# Patient Record
Sex: Female | Born: 1955 | Race: White | Hispanic: No | Marital: Married | Smoking: Never smoker
Health system: Southern US, Community
[De-identification: ages and names within clinical notes are randomized; demographics above are authoritative.]

## PROBLEM LIST (undated history)

## (undated) DIAGNOSIS — T7840XA Allergy, unspecified, initial encounter: Secondary | ICD-10-CM

## (undated) DIAGNOSIS — D649 Anemia, unspecified: Secondary | ICD-10-CM

## (undated) DIAGNOSIS — E785 Hyperlipidemia, unspecified: Secondary | ICD-10-CM

## (undated) DIAGNOSIS — E079 Disorder of thyroid, unspecified: Secondary | ICD-10-CM

## (undated) DIAGNOSIS — M810 Age-related osteoporosis without current pathological fracture: Secondary | ICD-10-CM

## (undated) DIAGNOSIS — M1990 Unspecified osteoarthritis, unspecified site: Secondary | ICD-10-CM

## (undated) HISTORY — DX: Hyperlipidemia, unspecified: E78.5

## (undated) HISTORY — PX: KNEE ARTHROSCOPY: SHX127

## (undated) HISTORY — PX: BREAST EXCISIONAL BIOPSY: SUR124

## (undated) HISTORY — DX: Disorder of thyroid, unspecified: E07.9

## (undated) HISTORY — PX: HERNIA REPAIR: SHX51

## (undated) HISTORY — DX: Anemia, unspecified: D64.9

## (undated) HISTORY — DX: Unspecified osteoarthritis, unspecified site: M19.90

## (undated) HISTORY — PX: APPENDECTOMY: SHX54

## (undated) HISTORY — DX: Allergy, unspecified, initial encounter: T78.40XA

## (undated) HISTORY — PX: BREAST LUMPECTOMY WITH AXILLARY LYMPH NODE BIOPSY: SHX5593

---

## 2002-09-12 ENCOUNTER — Encounter (INDEPENDENT_AMBULATORY_CARE_PROVIDER_SITE_OTHER): Payer: Self-pay

## 2002-09-12 ENCOUNTER — Ambulatory Visit (HOSPITAL_COMMUNITY): Admission: RE | Admit: 2002-09-12 | Discharge: 2002-09-12 | Payer: Self-pay | Admitting: *Deleted

## 2010-04-06 ENCOUNTER — Emergency Department (HOSPITAL_BASED_OUTPATIENT_CLINIC_OR_DEPARTMENT_OTHER): Admission: EM | Admit: 2010-04-06 | Discharge: 2010-04-06 | Payer: Self-pay | Admitting: Emergency Medicine

## 2010-11-22 NOTE — Op Note (Signed)
   NAME:  Kara Robinson, Kara Robinson                      ACCOUNT NO.:  [id number]   MEDICAL RECORD NO.:  [id number]                   PATIENT TYPE:  AMB   LOCATION:  ENDO                                 FACILITY:  [facility]   PHYSICIAN:  Georgiana Spinner, M.D.                 DATE OF BIRTH:  1955/07/25   DATE OF PROCEDURE:  DATE OF DISCHARGE:                                 OPERATIVE REPORT   PROCEDURE:  Upper endoscopy with biopsy.   INDICATIONS FOR PROCEDURE:  Hemoccult positivity.   ANESTHESIA:  Demerol 60, Versed 5 mg.   DESCRIPTION OF PROCEDURE:  With the patient mildly sedated in the left  lateral decubitus position, the Olympus videoscopic endoscope was inserted  in the mouth and passed under direct vision through the esophagus which  appeared normal other than one area of redness which we were not able to  photograph but we could biopsy. We entered into the stomach; the fundus,  body, antrum, duodenal bulb and second portion of the duodenum were  unremarkable.  From this point, the endoscope was slowly withdrawn taking  circumferential views of the remaining duodenal mucosa until the endoscope  was then pulled back in the stomach, placed in retroflexion to view the  stomach from below. The endoscope was then straightened and withdrawn taking  circumferential views of the remaining gastric and esophageal mucosa. The  patient's vital signs and pulse oximeter remained stable. The patient  tolerated the procedure well without apparent complications.   FINDINGS:  Question of reddened area of the distal esophagus biopsied,  otherwise, an unremarkable examination.   PLAN:  Proceed to colonoscopy as planned.                                                Georgiana Spinner, M.D.    GMO/MEDQ  D:  09/12/2002  T:  09/12/2002  Job:  [id number]

## 2010-11-22 NOTE — Op Note (Signed)
   NAME:  Kara Robinson, Kara Robinson                      ACCOUNT NO.:  [id number]   MEDICAL RECORD NO.:  [id number]                   PATIENT TYPE:  AMB   LOCATION:  ENDO                                 FACILITY:  [facility]   PHYSICIAN:  Georgiana Spinner, M.D.                 DATE OF BIRTH:  Dec 30, 1955   DATE OF PROCEDURE:  09/12/2002  DATE OF DISCHARGE:                                 OPERATIVE REPORT   PROCEDURE:  Colonoscopy.   INDICATIONS:  Hemoccult positivity.   ANESTHESIA:  Demerol 40 mg, Versed 5 mg.   DESCRIPTION OF PROCEDURE:  With the patient mildly sedated in the left  lateral decubitus position, the Olympus videoscopic colonoscope was inserted  in the rectum and passed through a very tortuous colon.  Eventually we  reached the cecum, identified by ileocecal valve and appendiceal orifice,  both of which were photographed.  From this point the colonoscope was slowly  withdrawn, taking circumferential views of the remaining very tortuous colon  until we reached the rectum, which appeared normal on direct and showed tags  on retroflexed view.  The endoscope was straightened and withdrawn.  The  patient's vital signs and pulse oximetry remained stable.  The patient  tolerated the procedure well without apparent complications.   FINDINGS:  Internal hemorrhoids, otherwise unremarkable colonoscopic  examination to the cecum other than a very tortuous colon.   PLAN:  Have the patient follow up with me as needed.                                               Georgiana Spinner, M.D.    GMO/MEDQ  D:  09/12/2002  T:  09/12/2002  Job:  [id number]   cc:   Dr. Simeon Craft, [facility]

## 2012-03-18 ENCOUNTER — Other Ambulatory Visit: Payer: Self-pay | Admitting: Orthopedic Surgery

## 2012-03-18 DIAGNOSIS — M25562 Pain in left knee: Secondary | ICD-10-CM

## 2012-03-21 ENCOUNTER — Ambulatory Visit
Admission: RE | Admit: 2012-03-21 | Discharge: 2012-03-21 | Disposition: A | Discharge status: Home / Self Care | Payer: 59 | Source: Ambulatory Visit | Attending: Orthopedic Surgery | Admitting: Orthopedic Surgery

## 2012-03-21 DIAGNOSIS — M25562 Pain in left knee: Secondary | ICD-10-CM

## 2012-07-28 ENCOUNTER — Other Ambulatory Visit: Payer: Self-pay | Admitting: Internal Medicine

## 2012-07-28 DIAGNOSIS — I83811 Varicose veins of right lower extremities with pain: Secondary | ICD-10-CM

## 2012-07-28 DIAGNOSIS — I8390 Asymptomatic varicose veins of unspecified lower extremity: Secondary | ICD-10-CM

## 2012-08-11 ENCOUNTER — Other Ambulatory Visit: Payer: 59

## 2012-08-24 ENCOUNTER — Ambulatory Visit
Admission: RE | Admit: 2012-08-24 | Discharge: 2012-08-24 | Disposition: A | Discharge status: Home / Self Care | Payer: Self-pay | Source: Ambulatory Visit | Attending: Internal Medicine | Admitting: Internal Medicine

## 2012-08-24 ENCOUNTER — Ambulatory Visit
Admission: RE | Admit: 2012-08-24 | Discharge: 2012-08-24 | Disposition: A | Discharge status: Home / Self Care | Payer: BC Managed Care – PPO | Source: Ambulatory Visit | Attending: Internal Medicine | Admitting: Internal Medicine

## 2012-08-24 DIAGNOSIS — I83811 Varicose veins of right lower extremities with pain: Secondary | ICD-10-CM

## 2012-08-24 DIAGNOSIS — I8390 Asymptomatic varicose veins of unspecified lower extremity: Secondary | ICD-10-CM

## 2012-09-04 LAB — HM COLONOSCOPY

## 2012-09-28 ENCOUNTER — Other Ambulatory Visit (HOSPITAL_COMMUNITY): Payer: Self-pay | Admitting: Interventional Radiology

## 2012-10-19 ENCOUNTER — Other Ambulatory Visit (HOSPITAL_COMMUNITY): Payer: Self-pay | Admitting: Interventional Radiology

## 2012-10-19 DIAGNOSIS — I83811 Varicose veins of right lower extremities with pain: Secondary | ICD-10-CM

## 2012-12-08 ENCOUNTER — Ambulatory Visit
Admission: RE | Admit: 2012-12-08 | Discharge: 2012-12-08 | Disposition: A | Discharge status: Home / Self Care | Payer: BC Managed Care – PPO | Source: Ambulatory Visit | Attending: Interventional Radiology | Admitting: Interventional Radiology

## 2012-12-08 DIAGNOSIS — I83811 Varicose veins of right lower extremities with pain: Secondary | ICD-10-CM

## 2012-12-08 NOTE — Progress Notes (Signed)
Patient here for 38-month follow-up to conservative treatment for varicose veins.  Patient states she continues with pain, swelling, throbbing of RLE despite wearing the compression hose.  Continues to take Aleve prn pain.  States LLE has some pain but not nearly as bad as the right side.  jkl

## 2013-01-03 ENCOUNTER — Telehealth: Payer: Self-pay | Admitting: Emergency Medicine

## 2013-01-03 ENCOUNTER — Other Ambulatory Visit (HOSPITAL_COMMUNITY): Payer: Self-pay | Admitting: Interventional Radiology

## 2013-01-03 DIAGNOSIS — I83811 Varicose veins of right lower extremities with pain: Secondary | ICD-10-CM

## 2013-01-03 NOTE — Telephone Encounter (Signed)
CALLED PT TO MAKE HER AWARE THAT HER INSURANCE APPROVED 3 SCLERO INJECTIONS AND IS GOOD TIL MAY 2015. SHE IS TAKING A BIG TRIP SOON AND  WILL CALL us WHEN SHE GETS BACK.

## 2013-04-07 ENCOUNTER — Other Ambulatory Visit: Payer: Self-pay | Admitting: Gastroenterology

## 2013-04-19 ENCOUNTER — Ambulatory Visit
Admission: RE | Admit: 2013-04-19 | Discharge: 2013-04-19 | Disposition: A | Discharge status: Home / Self Care | Payer: BC Managed Care – PPO | Source: Ambulatory Visit | Attending: Interventional Radiology | Admitting: Interventional Radiology

## 2013-04-19 DIAGNOSIS — I83811 Varicose veins of right lower extremities with pain: Secondary | ICD-10-CM

## 2013-04-20 ENCOUNTER — Other Ambulatory Visit (HOSPITAL_COMMUNITY): Payer: Self-pay | Admitting: Interventional Radiology

## 2013-04-20 DIAGNOSIS — I83811 Varicose veins of right lower extremities with pain: Secondary | ICD-10-CM

## 2013-04-22 ENCOUNTER — Ambulatory Visit: Payer: BC Managed Care – PPO

## 2013-05-17 ENCOUNTER — Ambulatory Visit
Admission: RE | Admit: 2013-05-17 | Discharge: 2013-05-17 | Disposition: A | Discharge status: Home / Self Care | Payer: BC Managed Care – PPO | Source: Ambulatory Visit | Attending: Interventional Radiology | Admitting: Interventional Radiology

## 2013-05-17 ENCOUNTER — Other Ambulatory Visit (HOSPITAL_COMMUNITY): Payer: Self-pay | Admitting: Interventional Radiology

## 2013-05-17 DIAGNOSIS — I83811 Varicose veins of right lower extremities with pain: Secondary | ICD-10-CM

## 2013-06-21 ENCOUNTER — Ambulatory Visit
Admission: RE | Admit: 2013-06-21 | Discharge: 2013-06-21 | Disposition: A | Discharge status: Home / Self Care | Payer: BC Managed Care – PPO | Source: Ambulatory Visit | Attending: Interventional Radiology | Admitting: Interventional Radiology

## 2013-06-21 DIAGNOSIS — I83811 Varicose veins of right lower extremities with pain: Secondary | ICD-10-CM

## 2015-07-08 LAB — HM PAP SMEAR: HM PAP: NORMAL

## 2017-04-06 ENCOUNTER — Telehealth: Payer: Self-pay

## 2017-04-06 NOTE — Telephone Encounter (Signed)
Pre visit call completed 

## 2017-04-07 ENCOUNTER — Encounter: Payer: Self-pay | Admitting: Family

## 2017-04-07 ENCOUNTER — Ambulatory Visit (INDEPENDENT_AMBULATORY_CARE_PROVIDER_SITE_OTHER): Payer: Managed Care, Other (non HMO) | Admitting: Family

## 2017-04-07 VITALS — BP 135/74 | HR 65 | Temp 97.9°F | Resp 16 | Ht 64.5 in | Wt 133.8 lb

## 2017-04-07 DIAGNOSIS — J302 Other seasonal allergic rhinitis: Secondary | ICD-10-CM

## 2017-04-07 DIAGNOSIS — E039 Hypothyroidism, unspecified: Secondary | ICD-10-CM | POA: Diagnosis not present

## 2017-04-07 DIAGNOSIS — M1990 Unspecified osteoarthritis, unspecified site: Secondary | ICD-10-CM | POA: Insufficient documentation

## 2017-04-07 DIAGNOSIS — Z23 Encounter for immunization: Secondary | ICD-10-CM

## 2017-04-07 DIAGNOSIS — Z0000 Encounter for general adult medical examination without abnormal findings: Secondary | ICD-10-CM

## 2017-04-07 DIAGNOSIS — E785 Hyperlipidemia, unspecified: Secondary | ICD-10-CM

## 2017-04-07 LAB — TSH: TSH: 0.54 u[IU]/mL (ref 0.35–4.50)

## 2017-04-07 MED ORDER — ZOSTER VAC RECOMB ADJUVANTED 50 MCG/0.5ML IM SUSR: INTRAMUSCULAR | 1 refills | Status: DC

## 2017-04-07 MED ORDER — CETIRIZINE HCL 10 MG PO TABS: 10.0000 mg | ORAL_TABLET | Freq: Every day | ORAL | 11 refills | Status: DC

## 2017-04-07 MED ORDER — LEVOTHYROXINE SODIUM 25 MCG PO TABS
25.0000 ug | ORAL_TABLET | Freq: Every day | ORAL | 1 refills | Status: DC
Start: 2017-04-07 — End: 2017-11-17

## 2017-04-07 MED ORDER — FLUTICASONE PROPIONATE 50 MCG/ACT NA SUSP: 2.0000 | Freq: Every day | NASAL | 6 refills | Status: AC

## 2017-04-07 NOTE — Assessment & Plan Note (Signed)
Stable on synthroid.  Obtain follow up TSH.

## 2017-04-07 NOTE — Assessment & Plan Note (Signed)
Will plan to obtain follow up lipid panel at her upcoming CPE.

## 2017-04-07 NOTE — Progress Notes (Signed)
Subjective:    Patient ID: Kara Robinson, female    DOB: March 14, 1956, 61 y.o.   MRN: [id number]  HPI  Kara Robinson is a 61 yr old female who presents today to establish care.  Reviewed most recent OV note from last PCP in Care everywhere.    Pmhx is significant for the following-  Hypothyroid- maintained on levothyroxine. TSH per care everywhere-  TSH 0.784 09/15/2016. She reports that she was diagnosed about 10 years ago. Has mild fatigue. Generally feels well though.    Arthritis-  Has in her feet/hands.  Uses tumeric tab which helps some.    Hx Anemia- reports that this was only during menopause due to heavy bleeding problems.   Allergies-Has seasonal allergies.  Uses zyrtec and flonase which helps her symptoms.    Hyperlipidemia- not on statin. Most recent lipids reviewed in Care everywhere as follows:   Component Value Date  CHOL 238 (H) 09/15/2016  TRIG 63 09/15/2016  HDL 84 09/15/2016  LDL 123 09/15/2016   Review of Systems  Constitutional: Negative for unexpected weight change.  HENT: Negative for hearing loss and rhinorrhea.   Eyes: Negative for visual disturbance.  Respiratory: Negative for cough and shortness of breath.   Cardiovascular: Negative for chest pain and leg swelling.  Gastrointestinal: Negative for diarrhea.       Reports gluten intolerance.  Has not had as much constipation off of gluten  Genitourinary: Negative for dysuria and frequency.  Musculoskeletal: Positive for arthralgias.  Skin: Negative for rash.  Neurological: Negative for headaches.  Hematological: Negative for adenopathy.  Psychiatric/Behavioral:       Denies depression/anxiety   Past Medical History:  Diagnosis Date  . Allergy   . Anemia   . Arthritis   . Hyperlipidemia    Family Hx  . Thyroid disease      Social History   Social History  . Marital status: Married    Spouse name: N/A  . Number of children: N/A  . Years of education: N/A   Occupational History  .  Not on file.   Social History Main Topics  . Smoking status: Never Smoker  . Smokeless tobacco: Never Used  . Alcohol use 0.6 - 1.2 oz/week    1 - 2 Glasses of wine per week  . Drug use: Unknown  . Sexual activity: Not on file   Other Topics Concern  . 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     Enjoys traveling, reading cooking, spending time with family   Married        Past Surgical History:  Procedure Laterality Date  . APPENDECTOMY    . BREAST LUMPECTOMY WITH AXILLARY LYMPH NODE BIOPSY    . HERNIA REPAIR    . KNEE ARTHROSCOPY Left     Family History  Problem Relation Age of Onset  . Heart disease Mother   . Hypertension Mother   . Kidney disease Father   . Kidney cancer Father   . Heart disease Sister        heart valve replacement--2016  . Diabetes type II Sister   . Other Sister        antisynthetase Syndrome    Allergies  Allergen Reactions  . Oxycodone Itching and Rash    Scratched herself until she  bled    No current outpatient prescriptions on file prior to visit.   No current facility-administered medications on file prior to visit.     BP 135/74 (BP Location: Right Arm, Cuff Size: Normal)   Pulse 65   Temp 97.9 F (36.6 C) (Oral)   Resp 16   Ht 5' 4.5" (1.638 m)   Wt 133 lb 12.8 oz (60.7 kg)   SpO2 100%   BMI 22.61 kg/m       Objective:   Physical Exam  Constitutional: She is oriented to person, place, and time. She appears well-developed and well-nourished.  HENT:  Head: Normocephalic.  Neck: No thyromegaly present.  Cardiovascular: Normal rate, regular rhythm and normal heart sounds.   No murmur heard. Pulmonary/Chest: Effort normal and breath sounds normal. No respiratory distress. She has no wheezes.  Lymphadenopathy:    She has no cervical adenopathy.  Neurological: She is  alert and oriented to person, place, and time.  Psychiatric: She has a normal mood and affect. Her behavior is normal. Judgment and thought content normal.          Assessment & Plan:  She is interested in Shingrix- we are out of stock. Will send rx to her pharmacy.

## 2017-04-07 NOTE — Addendum Note (Signed)
Addended by: Kelle Darting A on: 04/07/2017 05:13 PM   Modules accepted: Orders

## 2017-04-07 NOTE — Assessment & Plan Note (Signed)
· 

## 2017-04-07 NOTE — Patient Instructions (Addendum)
Please complete lab work prior to leaving. You can check with [facility] to see if they have the Shingrix in stock.   Schedule mammogram on the first floor. Schedule a complete physical at your convenience.   Welcome to [facility]!

## 2017-04-07 NOTE — Assessment & Plan Note (Signed)
Stable on zyrtec and flonase. Continue same.

## 2017-04-08 ENCOUNTER — Encounter: Payer: Self-pay | Admitting: Family

## 2017-04-08 NOTE — Progress Notes (Signed)
Mailed to pt/thx dmf

## 2017-04-28 ENCOUNTER — Encounter (HOSPITAL_BASED_OUTPATIENT_CLINIC_OR_DEPARTMENT_OTHER): Payer: Self-pay

## 2017-04-28 ENCOUNTER — Ambulatory Visit (INDEPENDENT_AMBULATORY_CARE_PROVIDER_SITE_OTHER): Payer: Managed Care, Other (non HMO) | Admitting: Family

## 2017-04-28 ENCOUNTER — Encounter: Payer: Self-pay | Admitting: Family

## 2017-04-28 ENCOUNTER — Ambulatory Visit (HOSPITAL_BASED_OUTPATIENT_CLINIC_OR_DEPARTMENT_OTHER)
Admission: RE | Admit: 2017-04-28 | Discharge: 2017-04-28 | Disposition: A | Discharge status: Home / Self Care | Payer: Managed Care, Other (non HMO) | Source: Ambulatory Visit | Attending: Family | Admitting: Family

## 2017-04-28 VITALS — BP 128/68 | HR 72 | Temp 97.9°F | Resp 16 | Ht 64.5 in | Wt 134.6 lb

## 2017-04-28 DIAGNOSIS — E559 Vitamin D deficiency, unspecified: Secondary | ICD-10-CM | POA: Diagnosis not present

## 2017-04-28 DIAGNOSIS — Z1231 Encounter for screening mammogram for malignant neoplasm of breast: Secondary | ICD-10-CM | POA: Diagnosis not present

## 2017-04-28 DIAGNOSIS — Z0000 Encounter for general adult medical examination without abnormal findings: Secondary | ICD-10-CM

## 2017-04-28 DIAGNOSIS — Z23 Encounter for immunization: Secondary | ICD-10-CM | POA: Diagnosis not present

## 2017-04-28 LAB — BASIC METABOLIC PANEL
BUN: 13 mg/dL (ref 6–23)
CHLORIDE: 105 meq/L (ref 96–112)
CO2: 31 meq/L (ref 19–32)
CREATININE: 0.63 mg/dL (ref 0.40–1.20)
Calcium: 9.1 mg/dL (ref 8.4–10.5)
GFR: 101.57 mL/min (ref >60.00)
Glucose, Bld: 80 mg/dL (ref 70–99)
Potassium: 4.2 mEq/L (ref 3.5–5.1)
Sodium: 141 mEq/L (ref 135–145)

## 2017-04-28 LAB — URINALYSIS, ROUTINE W REFLEX MICROSCOPIC
BILIRUBIN URINE: NEGATIVE
Hgb urine dipstick: NEGATIVE
KETONES UR: NEGATIVE
LEUKOCYTES UA: NEGATIVE
Nitrite: NEGATIVE
PH: 7.5 (ref 5.0–8.0)
RBC / HPF: NONE SEEN (ref 0–?)
Specific Gravity, Urine: 1.01 (ref 1.000–1.030)
TOTAL PROTEIN, URINE-UPE24: NEGATIVE
UROBILINOGEN UA: 0.2 (ref 0.0–1.0)
Urine Glucose: NEGATIVE

## 2017-04-28 LAB — LIPID PANEL
CHOL/HDL RATIO: 3
Cholesterol: 220 mg/dL — ABNORMAL HIGH (ref 0–200)
HDL: 83.5 mg/dL (ref >39.00)
LDL Cholesterol: 127 mg/dL — ABNORMAL HIGH (ref 0–99)
NONHDL: 136.74
TRIGLYCERIDES: 51 mg/dL (ref 0.0–149.0)
VLDL: 10.2 mg/dL (ref 0.0–40.0)

## 2017-04-28 LAB — CBC WITH DIFFERENTIAL/PLATELET
BASOS ABS: 0 10*3/uL (ref 0.0–0.1)
BASOS PCT: 0.3 % (ref 0.0–3.0)
EOS ABS: 0 10*3/uL (ref 0.0–0.7)
Eosinophils Relative: 0.6 % (ref 0.0–5.0)
HEMATOCRIT: 41.1 % (ref 36.0–46.0)
Hemoglobin: 13.4 g/dL (ref 12.0–15.0)
LYMPHS ABS: 1.5 10*3/uL (ref 0.7–4.0)
LYMPHS PCT: 34.2 % (ref 12.0–46.0)
MCHC: 32.7 g/dL (ref 30.0–36.0)
MCV: 95.7 fl (ref 78.0–100.0)
MONO ABS: 0.3 10*3/uL (ref 0.1–1.0)
Monocytes Relative: 7.7 % (ref 3.0–12.0)
NEUTROS ABS: 2.6 10*3/uL (ref 1.4–7.7)
NEUTROS PCT: 57.2 % (ref 43.0–77.0)
PLATELETS: 182 10*3/uL (ref 150.0–400.0)
RBC: 4.3 Mil/uL (ref 3.87–5.11)
RDW: 13.3 % (ref 11.5–15.5)
WBC: 4.5 10*3/uL (ref 4.0–10.5)

## 2017-04-28 LAB — HEPATIC FUNCTION PANEL
ALT: 13 U/L (ref 0–35)
AST: 15 U/L (ref 0–37)
Albumin: 4.1 g/dL (ref 3.5–5.2)
Alkaline Phosphatase: 40 U/L (ref 39–117)
BILIRUBIN DIRECT: 0.1 mg/dL (ref 0.0–0.3)
BILIRUBIN TOTAL: 0.6 mg/dL (ref 0.2–1.2)
TOTAL PROTEIN: 6.4 g/dL (ref 6.0–8.3)

## 2017-04-28 LAB — HM MAMMOGRAPHY

## 2017-04-28 LAB — VITAMIN D 25 HYDROXY (VIT D DEFICIENCY, FRACTURES): VITD: 45.81 ng/mL (ref 30.00–100.00)

## 2017-04-28 NOTE — Addendum Note (Signed)
Addended by: Kelle Darting A on: 04/28/2017 04:39 PM   Modules accepted: Orders

## 2017-04-28 NOTE — Progress Notes (Signed)
Subjective:    Patient ID: Kara Robinson, female    DOB: 1955/08/25, 61 y.o.   MRN: [id number]  HPI  Patient presents today for complete physical.  Immunizations: flu shot Diet: healthy Exercise: enjoys yoga, trainer, exercise bike every other day at home Colonoscopy: 2014, had polyps, was told follow up in 5 years.  Dexa: 2017- osteopenia per patient, takes calcium Pap Smear: 2017, normal per patient.  Mammogram:today Dental: 9/18 Vision: 6 months ago     Review of Systems  Constitutional: Negative for unexpected weight change.  HENT: Negative for rhinorrhea.        Some issues hearing in crowds, declines audiology referral  Eyes: Negative for visual disturbance.  Respiratory: Negative for cough and shortness of breath.   Cardiovascular: Negative for chest pain and leg swelling.  Gastrointestinal: Negative for blood in stool and diarrhea.       Occasional constipation- diet controlled  Genitourinary: Negative for dysuria, frequency and hematuria.  Musculoskeletal: Negative for arthralgias and myalgias.  Skin: Negative for rash.  Neurological: Negative for headaches.  Hematological: Negative for adenopathy.  Psychiatric/Behavioral:       Denies depression/anxiety   Past Medical History:  Diagnosis Date  . Allergy   . Anemia   . Arthritis   . Hyperlipidemia    Family Hx  . Thyroid disease      Social History   Social History  . Marital status: Married    Spouse name: N/A  . Number of children: N/A  . Years of education: N/A   Occupational History  . Not on file.   Social History Main Topics  . Smoking status: Never Smoker  . Smokeless tobacco: Never Used  . Alcohol use 0.6 - 1.2 oz/week    1 - 2 Glasses of wine per week  . Drug use: Unknown  . Sexual activity: Not on file   Other Topics Concern  . 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     Enjoys traveling, reading cooking, spending time with family   Married        Past Surgical History:  Procedure Laterality Date  . APPENDECTOMY    . BREAST LUMPECTOMY Left    x 2 , benign   . BREAST LUMPECTOMY WITH AXILLARY LYMPH NODE BIOPSY    . HERNIA REPAIR    . KNEE ARTHROSCOPY Left     Family History  Problem Relation Age of Onset  . Heart disease Mother        MI at age 64, smoker  . Hypertension Mother   . Kidney disease Father   . Kidney cancer Father   . Heart disease Sister        heart valve replacement--2016  . Diabetes type II Sister   . Other Sister        antisynthetase Syndrome    Allergies  Allergen Reactions  . Oxycodone Itching and Rash    Scratched herself until she bled    Current Outpatient Prescriptions on File Prior to Visit  Medication Sig Dispense Refill  . cetirizine (ZYRTEC) 10 MG tablet Take 1 tablet (10 mg total) by mouth daily. 30 tablet 11  . fluticasone (FLONASE) 50 MCG/ACT nasal spray Place 2 sprays into both nostrils daily. 16 g 6  . levothyroxine (SYNTHROID, LEVOTHROID)  25 MCG tablet Take 1 tablet (25 mcg total) by mouth daily. 90 tablet 1  . Zoster Vac Recomb Adjuvanted [facility]) injection Inject 82mcg IM now and again in 2-6 months (Patient not taking: Reported on 04/28/2017) 0.5 mL 1   No current facility-administered medications on file prior to visit.     BP 128/68 (BP Location: Right Arm, Cuff Size: Normal)   Pulse 72   Temp 97.9 F (36.6 C) (Oral)   Resp 16   Ht 5' 4.5" (1.638 m)   Wt 134 lb 9.6 oz (61.1 kg)   SpO2 99%   BMI 22.75 kg/m        Objective:   Physical Exam Physical Exam  Constitutional: She is oriented to person, place, and time. She appears well-developed and well-nourished. No distress.  HENT:  Head: Normocephalic and atraumatic.  Right Ear: Tympanic membrane and ear canal normal.  Left Ear: Tympanic membrane and ear canal normal.    Mouth/Throat: Oropharynx is clear and moist.  Eyes: Pupils are equal, round, and reactive to light. No scleral icterus.  Neck: Normal range of motion. No thyromegaly present.  Cardiovascular: Normal rate and regular rhythm.   No murmur heard. Pulmonary/Chest: Effort normal and breath sounds normal. No respiratory distress. He has no wheezes. She has no rales. She exhibits no tenderness.  Abdominal: Soft. Bowel sounds are normal. She exhibits no distension and no mass. There is no tenderness. There is no rebound and no guarding.  Musculoskeletal: She exhibits no edema.  Lymphadenopathy:    She has no cervical adenopathy.  Neurological: She is alert and oriented to person, place, and time. She has normal patellar reflexes. She exhibits normal muscle tone. Coordination normal.  Skin: Skin is warm and dry.  Psychiatric: She has a normal mood and affect. Her behavior is normal. Judgment and thought content normal.  Breasts: Examined lying Right: Without masses, retractions, discharge or axillary adenopathy.  Left: Without masses, retractions, discharge or axillary adenopathy.  Pelvic: deferred         Assessment & Plan:          Assessment & Plan:  Preventative Care- discussed continuing healthy diet and regular exercise. Obtain routine lab work. disussed shingrix which is on back order.  Tdap today.    EKG is personally reviewed.  No EKG available for comparison.  No acute changes.

## 2017-04-28 NOTE — Patient Instructions (Signed)
Please complete lab work prior to leaving.  Continue healthy diet and regular exercise.  

## 2017-04-30 ENCOUNTER — Other Ambulatory Visit: Payer: Self-pay | Admitting: Family Medicine

## 2017-04-30 ENCOUNTER — Telehealth: Payer: Self-pay | Admitting: Family

## 2017-04-30 ENCOUNTER — Encounter: Payer: Self-pay | Admitting: Family

## 2017-04-30 DIAGNOSIS — N6320 Unspecified lump in the left breast, unspecified quadrant: Secondary | ICD-10-CM

## 2017-04-30 NOTE — Telephone Encounter (Signed)
Relation to OI:PPGF Call back number:[phone]   D.O.D  Reason for call:  Patient had mamo 04/28/17 and was contacted by radiology stating additional breast images for further eval. Patient anxious to go today and would like orders placed. Patient was advised orders had to be placed with [facility], please advise

## 2017-04-30 NOTE — Telephone Encounter (Signed)
Spoke to patient, relayed below message. Patient verbalize understanding. Thank you

## 2017-04-30 NOTE — Telephone Encounter (Signed)
Please advise 

## 2017-04-30 NOTE — Telephone Encounter (Signed)
Please let patient know Dr. Charlett Blake has placed her orders

## 2017-04-30 NOTE — Telephone Encounter (Signed)
Doc of the day Wendling  Please advise

## 2017-05-06 ENCOUNTER — Other Ambulatory Visit: Payer: Self-pay | Admitting: Family Medicine

## 2017-05-06 ENCOUNTER — Ambulatory Visit
Admission: RE | Admit: 2017-05-06 | Discharge: 2017-05-06 | Disposition: A | Discharge status: Home / Self Care | Payer: Managed Care, Other (non HMO) | Source: Ambulatory Visit | Attending: Family Medicine | Admitting: Family Medicine

## 2017-05-06 DIAGNOSIS — N6320 Unspecified lump in the left breast, unspecified quadrant: Secondary | ICD-10-CM

## 2017-10-02 ENCOUNTER — Other Ambulatory Visit: Payer: Self-pay | Admitting: Family Medicine

## 2017-10-02 DIAGNOSIS — N6320 Unspecified lump in the left breast, unspecified quadrant: Secondary | ICD-10-CM

## 2017-10-06 ENCOUNTER — Other Ambulatory Visit: Payer: Self-pay | Admitting: Family Medicine

## 2017-10-06 ENCOUNTER — Ambulatory Visit
Admission: RE | Admit: 2017-10-06 | Discharge: 2017-10-06 | Disposition: A | Discharge status: Home / Self Care | Payer: Managed Care, Other (non HMO) | Source: Ambulatory Visit | Attending: Family Medicine | Admitting: Family Medicine

## 2017-10-06 DIAGNOSIS — N6320 Unspecified lump in the left breast, unspecified quadrant: Secondary | ICD-10-CM

## 2017-11-17 ENCOUNTER — Other Ambulatory Visit: Payer: Self-pay | Admitting: Family

## 2018-02-10 ENCOUNTER — Other Ambulatory Visit: Payer: Self-pay | Admitting: Family

## 2018-02-10 DIAGNOSIS — N390 Urinary tract infection, site not specified: Secondary | ICD-10-CM

## 2018-03-09 ENCOUNTER — Other Ambulatory Visit: Payer: Self-pay | Admitting: Family

## 2018-03-10 NOTE — Telephone Encounter (Signed)
Called pt and scheduled appt for 03/16/18.

## 2018-03-10 NOTE — Telephone Encounter (Signed)
14 day supply of levothyroxine sent to pharmacy as pt is past due for follow up and will need OV before further refills can be given. Please call pt to schedule appt as soon as possible. Thanks!

## 2018-03-16 ENCOUNTER — Ambulatory Visit: Payer: Managed Care, Other (non HMO) | Admitting: Family

## 2018-03-16 ENCOUNTER — Encounter: Payer: Self-pay | Admitting: Family

## 2018-03-16 VITALS — BP 141/66 | HR 67 | Temp 97.8°F | Resp 16 | Ht 65.0 in | Wt 149.6 lb

## 2018-03-16 DIAGNOSIS — E039 Hypothyroidism, unspecified: Secondary | ICD-10-CM

## 2018-03-16 DIAGNOSIS — Z9109 Other allergy status, other than to drugs and biological substances: Secondary | ICD-10-CM | POA: Diagnosis not present

## 2018-03-16 DIAGNOSIS — Z23 Encounter for immunization: Secondary | ICD-10-CM | POA: Diagnosis not present

## 2018-03-16 LAB — TSH: TSH: 0.48 u[IU]/mL (ref 0.35–4.50)

## 2018-03-16 NOTE — Addendum Note (Signed)
Addended by: Jiles Prows on: 03/16/2018 01:37 PM   Modules accepted: Orders

## 2018-03-16 NOTE — Progress Notes (Signed)
Subjective:    Patient ID: Kara Robinson, female    DOB: December 31, 1955, 62 y.o.   MRN: [id number]  HPI   Pt is a 62 yr old female who presents today for follow up.  Hypothyroid- she has gained 14 pounds this year. Reports that she has had a transition with moving and was eating out more. She is in her new home and plans to begin cooking more at home.  Lab Results  Component Value Date   TSH 0.54 04/07/2017   Wt Readings from Last 3 Encounters:  03/16/18 149 lb 9.6 oz (67.9 kg)  04/28/17 134 lb 9.6 oz (61.1 kg)  04/07/17 133 lb 12.8 oz (60.7 kg)    Seasonal allergies- reports that she is now on allegra and flonase. Reports that this has been helping.    Review of Systems See HPI  Past Medical History:  Diagnosis Date  . Allergy   . Anemia   . Arthritis   . Hyperlipidemia    Family Hx  . Thyroid disease      Social History   Socioeconomic History  . Marital status: Married    Spouse name: Not on file  . Number of children: Not on file  . Years of education: Not on file  . Highest education level: Not on file  Occupational History  . Not on file  Social Needs  . Financial resource strain: Not on file  . Food insecurity:    Worry: Not on file    Inability: Not on file  . Transportation needs:    Medical: Not on file    Non-medical: Not on file  Tobacco Use  . Smoking status: Never Smoker  . Smokeless tobacco: Never Used  Substance and Sexual Activity  . Alcohol use: Yes    Alcohol/week: 1.0 - 2.0 standard drinks    Types: 1 - 2 Glasses of wine per week  . Drug use: Not on file  . Sexual activity: Not on file  Lifestyle  . Physical activity:    Days per week: Not on file    Minutes per session: Not on file  . Stress: Not on file  Relationships  . Social connections:    Talks on phone: Not on file    Gets together: Not on file    Attends religious service: Not on file    Active member of club or organization: Not on file    Attends meetings of  clubs or organizations: Not on file    Relationship status: Not on file  . Intimate partner violence:    Fear of current or ex partner: Not on file    Emotionally abused: Not on file    Physically abused: Not on file    Forced sexual activity: Not on file  Other Topics Concern  . 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     Enjoys traveling, reading cooking, spending time with family   Married     Past Surgical History:  Procedure Laterality Date  . APPENDECTOMY    . BREAST EXCISIONAL BIOPSY Left   . BREAST LUMPECTOMY Left    x 2 , benign   . BREAST LUMPECTOMY WITH AXILLARY LYMPH NODE BIOPSY    . HERNIA REPAIR    . KNEE ARTHROSCOPY Left  Family History  Problem Relation Age of Onset  . Heart disease Mother        MI at age 78, smoker  . Hypertension Mother   . Kidney disease Father   . Kidney cancer Father   . Heart disease Sister        heart valve replacement--2016  . Diabetes type II Sister   . Other Sister        antisynthetase Syndrome    Allergies  Allergen Reactions  . Oxycodone Itching and Rash    Scratched herself until she bled    Current Outpatient Medications on File Prior to Visit  Medication Sig Dispense Refill  . fexofenadine (ALLEGRA) 180 MG tablet 1 tablet    . fluticasone (FLONASE) 50 MCG/ACT nasal spray Place 2 sprays into both nostrils daily. 16 g 6  . levothyroxine (SYNTHROID, LEVOTHROID) 25 MCG tablet TAKE 1 TABLET BY MOUTH DAILY 14 tablet 0  . Zoster Vac Recomb Adjuvanted [facility]) injection Inject 37mcg IM now and again in 2-6 months 0.5 mL 1   No current facility-administered medications on file prior to visit.     BP (!) 141/66 (BP Location: Right Arm, Patient Position: Sitting, Cuff Size: Small)   Pulse 67   Temp 97.8 F (36.6 C) (Oral)   Resp 16   Ht 5\' 5"  (1.651 m)   Wt 149 lb  9.6 oz (67.9 kg)   SpO2 99%   BMI 24.89 kg/m       Objective:   Physical Exam  Constitutional: She is oriented to person, place, and time. She appears well-developed and well-nourished.  Neck: No thyromegaly present.  Cardiovascular: Normal rate, regular rhythm and normal heart sounds.  No murmur heard. Pulmonary/Chest: Effort normal and breath sounds normal. No respiratory distress. She has no wheezes.  Neurological: She is alert and oriented to person, place, and time.  Skin: Skin is warm and dry.  Psychiatric: She has a normal mood and affect. Her behavior is normal. Judgment and thought content normal.          Assessment & Plan:  Hypothyroid- + weight gain. Obtain TSH, continue synthroid.  Environmental allergies- stable on allegra/flonase, continue same.  Flu shot and Shingrix #1 today.

## 2018-03-16 NOTE — Patient Instructions (Signed)
Please complete lab work prior to leaving.   

## 2018-03-17 ENCOUNTER — Telehealth: Payer: Self-pay | Admitting: Family

## 2018-03-17 DIAGNOSIS — E039 Hypothyroidism, unspecified: Secondary | ICD-10-CM

## 2018-03-17 MED ORDER — LEVOTHYROXINE SODIUM 25 MCG PO TABS: 12.5000 ug | ORAL_TABLET | Freq: Every day | ORAL | 1 refills | Status: DC

## 2018-03-17 NOTE — Telephone Encounter (Signed)
Notified pt and scheduled lab appt for 04/28/18 at 10:10am. Future order has been placed.

## 2018-03-17 NOTE — Telephone Encounter (Signed)
Please let patient know that it appears that her Synthroid dose is just slightly too strong for her.  I would like for her to cut the Synthroid in half and take half tablet once daily.  Please repeat TSH in 4 to 6 weeks.

## 2018-04-02 ENCOUNTER — Encounter: Payer: Self-pay | Admitting: Family

## 2018-04-08 ENCOUNTER — Other Ambulatory Visit: Payer: Managed Care, Other (non HMO)

## 2018-04-11 ENCOUNTER — Encounter (HOSPITAL_BASED_OUTPATIENT_CLINIC_OR_DEPARTMENT_OTHER): Payer: Self-pay | Admitting: Emergency Medicine

## 2018-04-11 ENCOUNTER — Emergency Department (HOSPITAL_BASED_OUTPATIENT_CLINIC_OR_DEPARTMENT_OTHER)
Admission: EM | Admit: 2018-04-11 | Discharge: 2018-04-11 | Disposition: A | Discharge status: Home / Self Care | Payer: Managed Care, Other (non HMO) | Attending: Emergency Medicine | Admitting: Emergency Medicine

## 2018-04-11 ENCOUNTER — Emergency Department (HOSPITAL_BASED_OUTPATIENT_CLINIC_OR_DEPARTMENT_OTHER): Payer: Managed Care, Other (non HMO)

## 2018-04-11 ENCOUNTER — Other Ambulatory Visit: Payer: Self-pay

## 2018-04-11 DIAGNOSIS — E039 Hypothyroidism, unspecified: Secondary | ICD-10-CM | POA: Diagnosis not present

## 2018-04-11 DIAGNOSIS — S91114A Laceration without foreign body of right lesser toe(s) without damage to nail, initial encounter: Secondary | ICD-10-CM | POA: Insufficient documentation

## 2018-04-11 DIAGNOSIS — J45909 Unspecified asthma, uncomplicated: Secondary | ICD-10-CM | POA: Diagnosis not present

## 2018-04-11 DIAGNOSIS — W260XXA Contact with knife, initial encounter: Secondary | ICD-10-CM | POA: Insufficient documentation

## 2018-04-11 DIAGNOSIS — Y93G3 Activity, cooking and baking: Secondary | ICD-10-CM | POA: Insufficient documentation

## 2018-04-11 DIAGNOSIS — Y929 Unspecified place or not applicable: Secondary | ICD-10-CM | POA: Diagnosis not present

## 2018-04-11 DIAGNOSIS — Y999 Unspecified external cause status: Secondary | ICD-10-CM | POA: Insufficient documentation

## 2018-04-11 MED ORDER — LIDOCAINE HCL (PF) 1 % IJ SOLN
5.0000 mL | Freq: Once | INTRAMUSCULAR | Status: AC
  Administered 2018-04-11: 5 mL
  Filled 2018-04-11: qty 5

## 2018-04-11 NOTE — Discharge Instructions (Addendum)
Treatment: Keep your wound dry and dressing applied until this time tomorrow. After 24 hours, you may wash with warm soapy water. Dry and apply antibiotic ointment and clean dressing. Do this daily until your sutures are removed. ° °Follow-up: Please follow-up with your primary care provider or return to emergency department in 10 days for suture removal. Be aware of signs of infection: fever, increasing pain, redness, swelling, drainage from the area. Please call your primary care provider or return to emergency department if you develop any of these symptoms or if any of the sutures come out prior to removal. Please return to the emergency department if you develop any other new or worsening symptoms. ° °

## 2018-04-11 NOTE — ED Notes (Signed)
Pt understood dc material. NAD noted. Suture removal discussed with patient

## 2018-04-11 NOTE — ED Provider Notes (Signed)
[facility] EMERGENCY DEPARTMENT Provider Note   CSN: [id number] Arrival date & time: 04/11/18  2049     History   Chief Complaint Chief Complaint  Patient presents with  . Extremity Laceration    HPI Kara Robinson is a 62 y.o. female with history of thyroid disease, asthma who presents with right second toe pain and laceration after dropping a knife on her foot.  She reports she was baking cookies and bare feet and accidentally dropped a knife which went straight on to her right second toe.  She has had continued bleeding.  She has full range of motion of the toe and denies any tingling.  She denies any other injuries.  Tetanus is up-to-date.  HPI  Past Medical History:  Diagnosis Date  . Allergy   . Anemia   . Arthritis   . Hyperlipidemia    Family Hx  . Thyroid disease     Patient Active Problem List   Diagnosis Date Noted  . Hypothyroid 04/07/2017  . Hyperlipidemia 04/07/2017  . Osteoarthritis 04/07/2017  . Seasonal allergies 04/07/2017    Past Surgical History:  Procedure Laterality Date  . APPENDECTOMY    . BREAST EXCISIONAL BIOPSY Left   . BREAST LUMPECTOMY Left    x 2 , benign   . BREAST LUMPECTOMY WITH AXILLARY LYMPH NODE BIOPSY    . HERNIA REPAIR    . KNEE ARTHROSCOPY Left      OB History   None      Home Medications    Prior to Admission medications   Medication Sig Start Date End Date Taking? Authorizing Provider  fexofenadine (ALLEGRA) 180 MG tablet 1 tablet    [provider]  fluticasone (FLONASE) 50 MCG/ACT nasal spray Place 2 sprays into both nostrils daily. 04/07/17   Debbrah Alar, NP  levothyroxine (SYNTHROID, LEVOTHROID) 25 MCG tablet Take 0.5 tablets (12.5 mcg total) by mouth daily. 03/17/18   Debbrah Alar, NP  Zoster Vac Recomb Adjuvanted [facility]) injection Inject 44mcg IM now and again in 2-6 months 04/07/17   Debbrah Alar, NP    Family History Family History  Problem Relation Age of  Onset  . Heart disease Mother        MI at age 11, smoker  . Hypertension Mother   . Kidney disease Father   . Kidney cancer Father   . Heart disease Sister        heart valve replacement--2016  . Diabetes type II Sister   . Other Sister        antisynthetase Syndrome    Social History Social History   Tobacco Use  . Smoking status: Never Smoker  . Smokeless tobacco: Never Used  Substance Use Topics  . Alcohol use: Yes    Alcohol/week: 1.0 - 2.0 standard drinks    Types: 1 - 2 Glasses of wine per week  . Drug use: Never     Allergies   Oxycodone   Review of Systems Review of Systems  Skin: Positive for wound.  Neurological: Negative for numbness.     Physical Exam Updated Vital Signs BP 131/60   Pulse 71   Temp 98.2 F (36.8 C) (Oral)   Resp 18   Ht 5' 4.5" (1.638 m)   Wt 66.2 kg   SpO2 97%   BMI 24.67 kg/m   Physical Exam  Constitutional: She appears well-developed and well-nourished. No distress.  HENT:  Head: Normocephalic and atraumatic.  Mouth/Throat: Oropharynx is clear and moist.  No oropharyngeal exudate.  Eyes: Pupils are equal, round, and reactive to light. Conjunctivae are normal. Right eye exhibits no discharge. Left eye exhibits no discharge. No scleral icterus.  Neck: Normal range of motion. Neck supple. No thyromegaly present.  Cardiovascular: Normal rate, regular rhythm, normal heart sounds and intact distal pulses. Exam reveals no gallop and no friction rub.  No murmur heard. Pulmonary/Chest: Effort normal and breath sounds normal. No stridor. No respiratory distress. She has no wheezes. She has no rales.  Musculoskeletal: She exhibits no edema.  1 cm laceration to the proximal aspect of the dorsal second toe, active slow bleeding, full range of motion of the toe with resistance, sensation intact, cap refill less than 2 seconds; DP pulse intact  Lymphadenopathy:    She has no cervical adenopathy.  Neurological: She is alert.  Coordination normal.  Skin: Skin is warm and dry. No rash noted. She is not diaphoretic. No pallor.  Psychiatric: She has a normal mood and affect.  Nursing note and vitals reviewed.    ED Treatments / Results  Labs (all labs ordered are listed, but only abnormal results are displayed) Labs Reviewed - No data to display  EKG None  Radiology Dg Toe 2nd Right  Result Date: 04/11/2018 CLINICAL DATA:  Dropped a knife on second toe, laceration. EXAM: RIGHT SECOND TOE COMPARISON:  None. FINDINGS: There is no evidence of fracture or dislocation. Hammertoe deformity of the digit. No radiopaque foreign body. Site of laceration not well delineated radiographically. Soft tissues are unremarkable. IMPRESSION: No fracture, dislocation, or radiopaque foreign body. Electronically Signed   By: Keith Rake M.D.   On: 04/11/2018 22:41    Procedures .Marland KitchenLaceration Repair Date/Time: 04/12/2018 12:03 AM Performed by: Frederica Kuster, PA-C Authorized by: Frederica Kuster, PA-C   Consent:    Consent obtained:  Verbal   Consent given by:  Patient   Risks discussed:  Pain, poor cosmetic result, infection and tendon damage   Alternatives discussed:  No treatment Anesthesia (see MAR for exact dosages):    Anesthesia method:  Local infiltration   Local anesthetic:  Lidocaine 1% w/o epi Laceration details:    Location:  Toe   Toe location:  R second toe   Length (cm):  1.5   Depth (mm):  4 Repair type:    Repair type:  Simple Pre-procedure details:    Preparation:  Patient was prepped and draped in usual sterile fashion and imaging obtained to evaluate for foreign bodies Exploration:    Hemostasis achieved with:  Direct pressure   Wound exploration: wound explored through full range of motion and entire depth of wound probed and visualized     Wound extent: no foreign bodies/material noted, no muscle damage noted, no tendon damage noted (patient has full ROM of digit, however question defect)  and no underlying fracture noted     Contaminated: no   Treatment:    Area cleansed with:  Saline   Amount of cleaning:  Standard   Irrigation solution:  Sterile saline   Irrigation volume:  100   Irrigation method:  Syringe   Visualized foreign bodies/material removed: no   Skin repair:    Repair method:  Sutures   Suture size:  5-0   Wound skin closure material used: Ethilon.   Suture technique:  Simple interrupted   Number of sutures:  2 Approximation:    Approximation:  Close Post-procedure details:    Dressing:  Antibiotic ointment and splint for protection  Patient tolerance of procedure:  Tolerated well, no immediate complications   (including critical care time)  Medications Ordered in ED Medications  lidocaine (PF) (XYLOCAINE) 1 % injection 5 mL (5 mLs Infiltration Given 04/11/18 2334)     Initial Impression / Assessment and Plan / ED Course  I have reviewed the triage vital signs and the nursing notes.  Pertinent labs & imaging results that were available during my care of the patient were reviewed by me and considered in my medical decision making (see chart for details).     Patient presenting with laceration to right second toe.  X-ray shows no fracture, dislocation, or foreign body.  Tetanus is up-to-date.  Patient has full range of motion of the digit, however question visualized defect in the tendon.  Will refer to orthopedics if patient's range of motion is different from prior to the injury after healing.  Laceration repaired.  Wound care discussed for home.  Patient advised to return or see her doctor in 10 days for suture removal.  She understands and agrees with plan.  Return precautions discussed.  Patient vitals stable throughout ED course and discharged in satisfactory condition. I discussed patient case with Dr. Ashok Cordia who guided the patient's management and agrees with plan.   Final Clinical Impressions(s) / ED Diagnoses   Final diagnoses:    Laceration of lesser toe of right foot without foreign body present or damage to nail, initial encounter    ED Discharge Orders    None       Frederica Kuster, PA-C 04/12/18 0006    Lajean Saver, MD 04/12/18 [phone]

## 2018-04-11 NOTE — ED Triage Notes (Signed)
Pt states she was cutting a pan of cookies and the knife slipped out of her hand and stabbed her middle toe on her right foot

## 2018-04-20 ENCOUNTER — Encounter: Payer: Self-pay | Admitting: Family

## 2018-04-20 ENCOUNTER — Ambulatory Visit: Payer: Managed Care, Other (non HMO) | Admitting: Family

## 2018-04-20 VITALS — BP 110/70 | HR 67 | Temp 97.8°F | Resp 16 | Ht 65.0 in | Wt 151.0 lb

## 2018-04-20 DIAGNOSIS — S91114A Laceration without foreign body of right lesser toe(s) without damage to nail, initial encounter: Secondary | ICD-10-CM

## 2018-04-20 NOTE — Progress Notes (Signed)
Subjective:    Patient ID: Kara Robinson, female    DOB: 1956/02/26, 62 y.o.   MRN: [id number]  HPI  Patient is a 62 yr old female who presents today for follow up of her her foot laceration.  She accidentally dropped a knife which struck her right second toe.  She presented to the ED on 10/6 and note is reviewed.  They performed x-rays which were unremarkable and there laceration was sutured. She was advised to follow up with [place] for suture removal.   She reports that the area is healing well. Denies numbness, pain or issues with ROM.    Review of Systems See HPI  Past Medical History:  Diagnosis Date  . Allergy   . Anemia   . Arthritis   . Hyperlipidemia    Family Hx  . Thyroid disease      Social History   Socioeconomic History  . Marital status: Married    Spouse name: Not on file  . Number of children: Not on file  . Years of education: Not on file  . Highest education level: Not on file  Occupational History  . Not on file  Social Needs  . Financial resource strain: Not on file  . Food insecurity:    Worry: Not on file    Inability: Not on file  . Transportation needs:    Medical: Not on file    Non-medical: Not on file  Tobacco Use  . Smoking status: Never Smoker  . Smokeless tobacco: Never Used  Substance and Sexual Activity  . Alcohol use: Yes    Alcohol/week: 1.0 - 2.0 standard drinks    Types: 1 - 2 Glasses of wine per week  . Drug use: Never  . Sexual activity: Not on file  Lifestyle  . Physical activity:    Days per week: Not on file    Minutes per session: Not on file  . Stress: Not on file  Relationships  . Social connections:    Talks on phone: Not on file    Gets together: Not on file    Attends religious service: Not on file    Active member of club or organization: Not on file    Attends meetings of clubs or organizations: Not on file    Relationship status: Not on file  . Intimate partner violence:    Fear of current or ex  partner: Not on file    Emotionally abused: Not on file    Physically abused: Not on file    Forced sexual activity: Not on file  Other Topics Concern  . 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     Enjoys traveling, reading cooking, spending time with family   Married     Past Surgical History:  Procedure Laterality Date  . APPENDECTOMY    . BREAST EXCISIONAL BIOPSY Left   . BREAST LUMPECTOMY Left    x 2 , benign   . BREAST LUMPECTOMY WITH AXILLARY LYMPH NODE BIOPSY    . HERNIA REPAIR    . KNEE ARTHROSCOPY Left     Family History  Problem Relation Age of Onset  . Heart disease Mother        MI at age 80, smoker  . Hypertension Mother   . Kidney disease  Father   . Kidney cancer Father   . Heart disease Sister        heart valve replacement--2016  . Diabetes type II Sister   . Other Sister        antisynthetase Syndrome    Allergies  Allergen Reactions  . Oxycodone Itching and Rash    Scratched herself until she bled    Current Outpatient Medications on File Prior to Visit  Medication Sig Dispense Refill  . fexofenadine (ALLEGRA) 180 MG tablet 1 tablet    . fluticasone (FLONASE) 50 MCG/ACT nasal spray Place 2 sprays into both nostrils daily. 16 g 6  . levothyroxine (SYNTHROID, LEVOTHROID) 25 MCG tablet Take 0.5 tablets (12.5 mcg total) by mouth daily. 45 tablet 1  . Zoster Vac Recomb Adjuvanted [facility]) injection Inject 62mcg IM now and again in 2-6 months 0.5 mL 1   No current facility-administered medications on file prior to visit.     BP 110/70 (BP Location: Right Arm, Patient Position: Sitting, Cuff Size: Small)   Pulse 67   Temp 97.8 F (36.6 C) (Oral)   Resp 16   Ht 5\' 5"  (1.651 m)   Wt 151 lb (68.5 kg)   SpO2 97%   BMI 25.13 kg/m       Objective:   Physical Exam  Constitutional: She is oriented  to person, place, and time. She appears well-developed and well-nourished.  Musculoskeletal:  Full ROM of right second toe  Neurological: She is alert and oriented to person, place, and time.  Skin: Skin is warm and dry.  Laceration right second toe is well healed.   Psychiatric: She has a normal mood and affect. Her behavior is normal. Judgment and thought content normal.          Assessment & Plan:  Laceration right toe- well healed. No sign of infection. 2 sutures removed. Pt tolerated without difficulty.

## 2018-04-28 ENCOUNTER — Other Ambulatory Visit: Payer: Managed Care, Other (non HMO)

## 2018-04-29 ENCOUNTER — Ambulatory Visit
Admission: RE | Admit: 2018-04-29 | Discharge: 2018-04-29 | Disposition: A | Discharge status: Home / Self Care | Payer: Managed Care, Other (non HMO) | Source: Ambulatory Visit | Attending: Family Medicine | Admitting: Family Medicine

## 2018-04-29 DIAGNOSIS — N6320 Unspecified lump in the left breast, unspecified quadrant: Secondary | ICD-10-CM

## 2018-05-18 ENCOUNTER — Ambulatory Visit (INDEPENDENT_AMBULATORY_CARE_PROVIDER_SITE_OTHER): Payer: Managed Care, Other (non HMO) | Admitting: Family

## 2018-05-18 ENCOUNTER — Encounter: Payer: Self-pay | Admitting: Family

## 2018-05-18 VITALS — BP 133/69 | HR 73 | Temp 97.6°F | Resp 16 | Ht 65.0 in | Wt 152.0 lb

## 2018-05-18 DIAGNOSIS — M8580 Other specified disorders of bone density and structure, unspecified site: Secondary | ICD-10-CM | POA: Diagnosis not present

## 2018-05-18 DIAGNOSIS — Z0000 Encounter for general adult medical examination without abnormal findings: Secondary | ICD-10-CM

## 2018-05-18 DIAGNOSIS — R682 Dry mouth, unspecified: Secondary | ICD-10-CM

## 2018-05-18 DIAGNOSIS — Z23 Encounter for immunization: Secondary | ICD-10-CM | POA: Diagnosis not present

## 2018-05-18 DIAGNOSIS — H9190 Unspecified hearing loss, unspecified ear: Secondary | ICD-10-CM

## 2018-05-18 LAB — BASIC METABOLIC PANEL
BUN: 11 mg/dL (ref 6–23)
CALCIUM: 9.2 mg/dL (ref 8.4–10.5)
CO2: 28 mEq/L (ref 19–32)
CREATININE: 0.64 mg/dL (ref 0.40–1.20)
Chloride: 104 mEq/L (ref 96–112)
GFR: 99.4 mL/min (ref >60.00)
GLUCOSE: 90 mg/dL (ref 70–99)
Potassium: 4.1 mEq/L (ref 3.5–5.1)
SODIUM: 141 meq/L (ref 135–145)

## 2018-05-18 LAB — CBC WITH DIFFERENTIAL/PLATELET
BASOS PCT: 0.6 % (ref 0.0–3.0)
Basophils Absolute: 0 10*3/uL (ref 0.0–0.1)
EOS PCT: 0.7 % (ref 0.0–5.0)
Eosinophils Absolute: 0 10*3/uL (ref 0.0–0.7)
HEMATOCRIT: 42.5 % (ref 36.0–46.0)
HEMOGLOBIN: 14.3 g/dL (ref 12.0–15.0)
Lymphocytes Relative: 40.6 % (ref 12.0–46.0)
Lymphs Abs: 1.9 10*3/uL (ref 0.7–4.0)
MCHC: 33.6 g/dL (ref 30.0–36.0)
MCV: 92.7 fl (ref 78.0–100.0)
MONO ABS: 0.3 10*3/uL (ref 0.1–1.0)
Monocytes Relative: 6.6 % (ref 3.0–12.0)
NEUTROS ABS: 2.4 10*3/uL (ref 1.4–7.7)
Neutrophils Relative %: 51.5 % (ref 43.0–77.0)
PLATELETS: 208 10*3/uL (ref 150.0–400.0)
RBC: 4.58 Mil/uL (ref 3.87–5.11)
RDW: 13.8 % (ref 11.5–15.5)
WBC: 4.6 10*3/uL (ref 4.0–10.5)

## 2018-05-18 LAB — URINALYSIS, ROUTINE W REFLEX MICROSCOPIC
Bilirubin Urine: NEGATIVE
KETONES UR: NEGATIVE
Leukocytes, UA: NEGATIVE
NITRITE: NEGATIVE
PH: 7 (ref 5.0–8.0)
Total Protein, Urine: NEGATIVE
Urine Glucose: NEGATIVE
Urobilinogen, UA: 0.2 (ref 0.0–1.0)
WBC UA: NONE SEEN (ref 0–?)

## 2018-05-18 LAB — HEPATIC FUNCTION PANEL
ALT: 10 U/L (ref 0–35)
AST: 14 U/L (ref 0–37)
Albumin: 4.5 g/dL (ref 3.5–5.2)
Alkaline Phosphatase: 65 U/L (ref 39–117)
BILIRUBIN DIRECT: 0.1 mg/dL (ref 0.0–0.3)
BILIRUBIN TOTAL: 0.5 mg/dL (ref 0.2–1.2)
Total Protein: 6.8 g/dL (ref 6.0–8.3)

## 2018-05-18 LAB — LIPID PANEL
CHOL/HDL RATIO: 3
CHOLESTEROL: 251 mg/dL — AB (ref 0–200)
HDL: 91.2 mg/dL (ref >39.00)
LDL CALC: 145 mg/dL — AB (ref 0–99)
NonHDL: 159.53
TRIGLYCERIDES: 71 mg/dL (ref 0.0–149.0)
VLDL: 14.2 mg/dL (ref 0.0–40.0)

## 2018-05-18 LAB — TSH: TSH: 0.57 u[IU]/mL (ref 0.35–4.50)

## 2018-05-18 NOTE — Patient Instructions (Signed)
Please complete lab work prior to leaving.   

## 2018-05-18 NOTE — Progress Notes (Signed)
Subjective:    Patient ID: Kara Robinson, female    DOB: 10-Jun-1956, 62 y.o.   MRN: [id number]  HPI  Patient presents today for complete physical.  Immunizations: Shinrix #2 Diet: healthy diet, started cooking more, trying to lose weight Exercise: trainer once a week, yoga once a week and rides bike Wt Readings from Last 3 Encounters:  05/18/18 152 lb (68.9 kg)  04/20/18 151 lb (68.5 kg)  04/11/18 146 lb (66.2 kg)    Colonoscopy: 2014 Dexa: due Pap Smear:1/17 Mammogram:  04/29/18  C/o dry mouth. Dry mouth and dry eyes.     Review of Systems  Constitutional: Negative for unexpected weight change.  HENT: Negative for rhinorrhea.        Finds herself asking to repeat things more often especially in a noisy room  Eyes: Negative for visual disturbance.  Respiratory: Negative for cough.   Cardiovascular: Negative for leg swelling.  Gastrointestinal: Negative for constipation (occasional) and diarrhea.  Genitourinary: Negative for dysuria, frequency and hematuria.  Musculoskeletal: Negative for arthralgias and myalgias.  Skin: Negative for rash.  Neurological: Negative for headaches.  Hematological: Negative for adenopathy.  Psychiatric/Behavioral:       Denies depression/anxiety   Past Medical History:  Diagnosis Date  . Allergy   . Anemia   . Arthritis   . Hyperlipidemia    Family Hx  . Thyroid disease      Social History   Socioeconomic History  . Marital status: Married    Spouse name: Not on file  . Number of children: Not on file  . Years of education: Not on file  . Highest education level: Not on file  Occupational History  . Not on file  Social Needs  . Financial resource strain: Not on file  . Food insecurity:    Worry: Not on file    Inability: Not on file  . Transportation needs:    Medical: Not on file    Non-medical: Not on file  Tobacco Use  . Smoking status: Never Smoker  . Smokeless tobacco: Never Used  Substance and Sexual  Activity  . Alcohol use: Yes    Alcohol/week: 1.0 - 2.0 standard drinks    Types: 1 - 2 Glasses of wine per week  . Drug use: Never  . Sexual activity: Not on file  Lifestyle  . Physical activity:    Days per week: Not on file    Minutes per session: Not on file  . Stress: Not on file  Relationships  . Social connections:    Talks on phone: Not on file    Gets together: Not on file    Attends religious service: Not on file    Active member of club or organization: Not on file    Attends meetings of clubs or organizations: Not on file    Relationship status: Not on file  . Intimate partner violence:    Fear of current or ex partner: Not on file    Emotionally abused: Not on file    Physically abused: Not on file    Forced sexual activity: Not on file  Other Topics Concern  . 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     Enjoys traveling, reading cooking, spending time with family   Married  Past Surgical History:  Procedure Laterality Date  . APPENDECTOMY    . BREAST EXCISIONAL BIOPSY Left    x 2  . BREAST LUMPECTOMY WITH AXILLARY LYMPH NODE BIOPSY    . HERNIA REPAIR    . KNEE ARTHROSCOPY Left     Family History  Problem Relation Age of Onset  . Heart disease Mother        MI at age 37, smoker  . Hypertension Mother   . Kidney disease Father   . Kidney cancer Father   . Heart disease Sister        heart valve replacement--2016  . Diabetes type II Sister   . Other Sister        antisynthetase Syndrome    Allergies  Allergen Reactions  . Oxycodone Itching and Rash    Scratched herself until she bled    Current Outpatient Medications on File Prior to Visit  Medication Sig Dispense Refill  . fexofenadine (ALLEGRA) 180 MG tablet 1 tablet    . fluticasone (FLONASE) 50 MCG/ACT nasal spray Place 2 sprays into both  nostrils daily. 16 g 6  . levothyroxine (SYNTHROID, LEVOTHROID) 25 MCG tablet Take 0.5 tablets (12.5 mcg total) by mouth daily. 45 tablet 1   No current facility-administered medications on file prior to visit.     BP 133/69 (BP Location: Left Arm, Cuff Size: Normal)   Pulse 73   Temp 97.6 F (36.4 C) (Oral)   Resp 16   Ht 5\' 5"  (1.651 m)   Wt 152 lb (68.9 kg)   SpO2 100%   BMI 25.29 kg/m       Objective:   Physical Exam  Physical Exam  Constitutional: She is oriented to person, place, and time. She appears well-developed and well-nourished. No distress.  HENT:  Head: Normocephalic and atraumatic.  Right Ear: Tympanic membrane and ear canal normal.  Left Ear: Tympanic membrane and ear canal normal.  Mouth/Throat: Oropharynx is clear and moist.  Eyes: Pupils are equal, round, and reactive to light. No scleral icterus.  Neck: Normal range of motion. No thyromegaly present.  Cardiovascular: Normal rate and regular rhythm.   No murmur heard. Pulmonary/Chest: Effort normal and breath sounds normal. No respiratory distress. He has no wheezes. She has no rales. She exhibits no tenderness.  Abdominal: Soft. Bowel sounds are normal. She exhibits no distension and no mass. There is no tenderness. There is no rebound and no guarding.  Musculoskeletal: She exhibits no edema.  Lymphadenopathy:    She has no cervical adenopathy.  Neurological: She is alert and oriented to person, place, and time. She has normal patellar reflexes. She exhibits normal muscle tone. Coordination normal.  Skin: Skin is warm and dry.  Psychiatric: She has a normal mood and affect. Her behavior is normal. Judgment and thought content normal.  Breasts: Examined lying Right: Without masses, retractions, discharge or axillary adenopathy.  Left: Without masses, retractions, discharge or axillary adenopathy.  Pelvic: deferred          Assessment & Plan:   Preventative care- Continue healthy diet/exercise.  Pap up to date. mammo up to date. Her colo was performed 5 years ago. I have asked her to contact her GI office to ask if they would like it repeated in 5 or 10 years and let me know if she needs a formal referral for colonoscopy. Shingrix #2 today.     Assessment & Plan:  EKG tracing is personally reviewed.  EKG notes NSR.  No  acute changes.   Dry mouth- obtain sjogren's antibodies.  Hearing problem- refer to audiology.

## 2018-05-19 ENCOUNTER — Encounter: Payer: Self-pay | Admitting: Family

## 2018-05-19 LAB — SJOGREN'S SYNDROME ANTIBODS(SSA + SSB)
SSA (Ro) (ENA) Antibody, IgG: <1 AI
SSB (La) (ENA) Antibody, IgG: <1 AI

## 2018-05-24 ENCOUNTER — Ambulatory Visit (HOSPITAL_BASED_OUTPATIENT_CLINIC_OR_DEPARTMENT_OTHER)
Admission: RE | Admit: 2018-05-24 | Discharge: 2018-05-24 | Disposition: A | Discharge status: Home / Self Care | Payer: Managed Care, Other (non HMO) | Source: Ambulatory Visit | Attending: Family | Admitting: Family

## 2018-05-24 ENCOUNTER — Encounter: Payer: Self-pay | Admitting: Family

## 2018-05-24 DIAGNOSIS — M8580 Other specified disorders of bone density and structure, unspecified site: Secondary | ICD-10-CM | POA: Diagnosis not present

## 2018-05-25 ENCOUNTER — Encounter: Payer: Self-pay | Admitting: Family

## 2018-05-25 DIAGNOSIS — E039 Hypothyroidism, unspecified: Secondary | ICD-10-CM

## 2018-05-26 MED ORDER — LEVOTHYROXINE SODIUM 25 MCG PO TABS: 12.5000 ug | ORAL_TABLET | Freq: Every day | ORAL | 1 refills | Status: DC

## 2018-07-12 ENCOUNTER — Other Ambulatory Visit: Payer: Managed Care, Other (non HMO)

## 2018-07-19 ENCOUNTER — Other Ambulatory Visit (INDEPENDENT_AMBULATORY_CARE_PROVIDER_SITE_OTHER): Payer: Managed Care, Other (non HMO)

## 2018-07-19 DIAGNOSIS — E039 Hypothyroidism, unspecified: Secondary | ICD-10-CM | POA: Diagnosis not present

## 2018-07-19 LAB — TSH: TSH: 0.88 u[IU]/mL (ref 0.35–4.50)

## 2018-08-17 ENCOUNTER — Telehealth: Payer: Self-pay | Admitting: *Deleted

## 2018-08-17 NOTE — Telephone Encounter (Signed)
Received Audiologic Report results from [facility]; forwarded to provider/SLS 02/11

## 2018-09-05 ENCOUNTER — Other Ambulatory Visit: Payer: Self-pay | Admitting: Family

## 2018-11-16 ENCOUNTER — Ambulatory Visit: Payer: Managed Care, Other (non HMO) | Admitting: Family

## 2018-12-02 ENCOUNTER — Telehealth: Payer: Self-pay | Admitting: Family

## 2018-12-02 NOTE — Telephone Encounter (Signed)
Copied from [facility] [id number]. Topic: Quick Communication - Appointment Cancellation >> Dec 02, 2018 11:54 AM Kara Robinson wrote: Patient needs to r/s her appt that she has on 6/2. Attempted office.

## 2018-12-07 ENCOUNTER — Ambulatory Visit: Payer: Managed Care, Other (non HMO) | Admitting: Family

## 2018-12-08 NOTE — Telephone Encounter (Signed)
Patient scheduled for 12-29-18

## 2018-12-29 ENCOUNTER — Ambulatory Visit (INDEPENDENT_AMBULATORY_CARE_PROVIDER_SITE_OTHER): Payer: Managed Care, Other (non HMO) | Admitting: Family

## 2018-12-29 ENCOUNTER — Other Ambulatory Visit: Payer: Self-pay

## 2018-12-29 DIAGNOSIS — E785 Hyperlipidemia, unspecified: Secondary | ICD-10-CM

## 2018-12-29 DIAGNOSIS — L989 Disorder of the skin and subcutaneous tissue, unspecified: Secondary | ICD-10-CM

## 2018-12-29 DIAGNOSIS — J302 Other seasonal allergic rhinitis: Secondary | ICD-10-CM | POA: Diagnosis not present

## 2018-12-29 DIAGNOSIS — E039 Hypothyroidism, unspecified: Secondary | ICD-10-CM | POA: Diagnosis not present

## 2018-12-29 MED ORDER — LEVOTHYROXINE SODIUM 25 MCG PO TABS: ORAL_TABLET | ORAL | 1 refills | Status: DC

## 2018-12-29 NOTE — Progress Notes (Signed)
Virtual Visit via Video Note  I connected with Kara Robinson on 12/29/18 at 11:00 AM EDT by a video enabled telemedicine application and verified that I am speaking with the correct person using two identifiers.  Location: Patient: home Provider: home    I discussed the limitations of evaluation and management by telemedicine and the availability of in person appointments. The patient expressed understanding and agreed to proceed.  History of Present Illness:  Patient is a 63 yr old female who presents today for follow up.  Hypothyroid- reports that she is feeling well on synthroid.   Reports that home weight is 145.   Wt Readings from Last 3 Encounters:  05/18/18 152 lb (68.9 kg)  04/20/18 151 lb (68.5 kg)  04/11/18 146 lb (66.2 kg)    Lab Results  Component Value Date   TSH 0.88 07/19/2018   Hyperlipidemia-  Lab Results  Component Value Date   CHOL 251 (H) 05/18/2018   HDL 91.20 05/18/2018   LDLCALC 145 (H) 05/18/2018   TRIG 71.0 05/18/2018   CHOLHDL 3 05/18/2018   Seasonal allergies- continues allegra and flonase. Uses an occasional sudafed which helps.  Reports that she is concerned about a skin lesion on her right temple which has changed and would like a referral to dermatology.   Past Medical History:  Diagnosis Date  . Allergy   . Anemia   . Arthritis   . Hyperlipidemia    Family Hx  . Thyroid disease      Social History   Socioeconomic History  . Marital status: Married    Spouse name: Not on file  . Number of children: Not on file  . Years of education: Not on file  . Highest education level: Not on file  Occupational History  . Not on file  Social Needs  . Financial resource strain: Not on file  . Food insecurity    Worry: Not on file    Inability: Not on file  . Transportation needs    Medical: Not on file    Non-medical: Not on file  Tobacco Use  . Smoking status: Never Smoker  . Smokeless tobacco: Never Used  Substance and  Sexual Activity  . Alcohol use: Yes    Alcohol/week: 1.0 - 2.0 standard drinks    Types: 1 - 2 Glasses of wine per week  . Drug use: Never  . Sexual activity: Not on file  Lifestyle  . Physical activity    Days per week: Not on file    Minutes per session: Not on file  . Stress: Not on file  Relationships  . Social Herbalist on phone: Not on file    Gets together: Not on file    Attends religious service: Not on file    Active member of club or organization: Not on file    Attends meetings of clubs or organizations: Not on file    Relationship status: Not on file  . Intimate partner violence    Fear of current or ex partner: Not on file    Emotionally abused: Not on file    Physically abused: Not on file    Forced sexual activity: Not on file  Other Topics Concern  . 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  Enjoys traveling, reading cooking, spending time with family   Married     Past Surgical History:  Procedure Laterality Date  . APPENDECTOMY    . BREAST EXCISIONAL BIOPSY Left    x 2  . BREAST LUMPECTOMY WITH AXILLARY LYMPH NODE BIOPSY    . HERNIA REPAIR    . KNEE ARTHROSCOPY Left     Family History  Problem Relation Age of Onset  . Heart disease Mother        MI at age 59, smoker  . Hypertension Mother   . Kidney disease Father   . Kidney cancer Father   . Heart disease Sister        heart valve replacement--2016  . Diabetes type II Sister   . Other Sister        antisynthetase Syndrome    Allergies  Allergen Reactions  . Oxycodone Itching and Rash    Scratched herself until she bled    Current Outpatient Medications on File Prior to Visit  Medication Sig Dispense Refill  . Biotin 1 MG CAPS Take by mouth.    . cholecalciferol (VITAMIN D3) 25 MCG (1000 UT) tablet Take 1,000 Units by mouth  daily.    . Coenzyme Q10 10 MG capsule Take by mouth.    . fexofenadine (ALLEGRA) 180 MG tablet 1 tablet    . fluticasone (FLONASE) 50 MCG/ACT nasal spray Place 2 sprays into both nostrils daily. 16 g 6   No current facility-administered medications on file prior to visit.     There were no vitals taken for this visit.     Observations/Objective:   Gen: Awake, alert, no acute distress Resp: Breathing is even and non-labored Psych: calm/pleasant demeanor Neuro: Alert and Oriented x 3, + facial symmetry, speech is clear.   Assessment and Plan:  Hyperlipidemia- obtain follow up lipid panel. Continue low fat/low cholesterol diet and exercise. Commended pt on her weight loss.  Hypothyroid- obtain follow up tsh.  Clinically stable on sythroid.   Seasonal allergies- stable on current medication regimen. Continue same.  Follow Up Instructions:    I discussed the assessment and treatment plan with the patient. The patient was provided an opportunity to ask questions and all were answered. The patient agreed with the plan and demonstrated an understanding of the instructions.   The patient was advised to call back or seek an in-person evaluation if the symptoms worsen or if the condition fails to improve as anticipated.  [name], NP

## 2019-01-06 ENCOUNTER — Other Ambulatory Visit (INDEPENDENT_AMBULATORY_CARE_PROVIDER_SITE_OTHER): Payer: Managed Care, Other (non HMO)

## 2019-01-06 ENCOUNTER — Other Ambulatory Visit: Payer: Self-pay

## 2019-01-06 DIAGNOSIS — E039 Hypothyroidism, unspecified: Secondary | ICD-10-CM

## 2019-01-06 DIAGNOSIS — E785 Hyperlipidemia, unspecified: Secondary | ICD-10-CM

## 2019-01-06 LAB — LIPID PANEL
Cholesterol: 238 mg/dL — ABNORMAL HIGH (ref 0–200)
HDL: 88.2 mg/dL (ref >39.00)
LDL Cholesterol: 138 mg/dL — ABNORMAL HIGH (ref 0–99)
NonHDL: 150.24
Total CHOL/HDL Ratio: 3
Triglycerides: 62 mg/dL (ref 0.0–149.0)
VLDL: 12.4 mg/dL (ref 0.0–40.0)

## 2019-01-06 LAB — TSH: TSH: 0.78 u[IU]/mL (ref 0.35–4.50)

## 2019-01-06 NOTE — Addendum Note (Signed)
Addended by: Kelle Darting A on: 01/06/2019 09:38 AM   Modules accepted: Orders

## 2019-04-01 ENCOUNTER — Other Ambulatory Visit: Payer: Self-pay | Admitting: Family

## 2019-04-01 ENCOUNTER — Other Ambulatory Visit: Payer: Self-pay | Admitting: Emergency Medicine

## 2019-04-01 DIAGNOSIS — D242 Benign neoplasm of left breast: Secondary | ICD-10-CM

## 2019-04-04 ENCOUNTER — Encounter: Payer: Managed Care, Other (non HMO) | Admitting: Family

## 2019-04-07 ENCOUNTER — Ambulatory Visit (INDEPENDENT_AMBULATORY_CARE_PROVIDER_SITE_OTHER): Payer: Managed Care, Other (non HMO)

## 2019-04-07 ENCOUNTER — Other Ambulatory Visit: Payer: Self-pay

## 2019-04-07 DIAGNOSIS — Z23 Encounter for immunization: Secondary | ICD-10-CM | POA: Diagnosis not present

## 2019-04-07 NOTE — Progress Notes (Signed)
Here for flu shot

## 2019-04-08 ENCOUNTER — Encounter: Payer: Managed Care, Other (non HMO) | Admitting: Family

## 2019-05-02 ENCOUNTER — Ambulatory Visit
Admission: RE | Admit: 2019-05-02 | Discharge: 2019-05-02 | Disposition: A | Discharge status: Home / Self Care | Payer: Managed Care, Other (non HMO) | Source: Ambulatory Visit | Attending: Family | Admitting: Family

## 2019-05-02 ENCOUNTER — Other Ambulatory Visit: Payer: Self-pay

## 2019-05-02 DIAGNOSIS — D242 Benign neoplasm of left breast: Secondary | ICD-10-CM

## 2019-05-19 ENCOUNTER — Other Ambulatory Visit: Payer: Self-pay

## 2019-05-20 ENCOUNTER — Other Ambulatory Visit: Payer: Self-pay

## 2019-05-20 ENCOUNTER — Other Ambulatory Visit (HOSPITAL_COMMUNITY)
Admission: RE | Admit: 2019-05-20 | Discharge: 2019-05-20 | Disposition: A | Discharge status: Home / Self Care | Payer: Managed Care, Other (non HMO) | Source: Ambulatory Visit | Attending: Family | Admitting: Family

## 2019-05-20 ENCOUNTER — Encounter: Payer: Self-pay | Admitting: Family

## 2019-05-20 ENCOUNTER — Ambulatory Visit (INDEPENDENT_AMBULATORY_CARE_PROVIDER_SITE_OTHER): Payer: Managed Care, Other (non HMO) | Admitting: Family

## 2019-05-20 VITALS — BP 129/69 | HR 67 | Temp 96.0°F | Resp 16 | Ht 64.5 in | Wt 147.0 lb

## 2019-05-20 DIAGNOSIS — Z0000 Encounter for general adult medical examination without abnormal findings: Secondary | ICD-10-CM

## 2019-05-20 DIAGNOSIS — Z01419 Encounter for gynecological examination (general) (routine) without abnormal findings: Secondary | ICD-10-CM | POA: Diagnosis not present

## 2019-05-20 LAB — CBC WITH DIFFERENTIAL/PLATELET
Basophils Absolute: 0 10*3/uL (ref 0.0–0.1)
Basophils Relative: 0.4 % (ref 0.0–3.0)
Eosinophils Absolute: 0 10*3/uL (ref 0.0–0.7)
Eosinophils Relative: 1.1 % (ref 0.0–5.0)
HCT: 41.2 % (ref 36.0–46.0)
Hemoglobin: 13.9 g/dL (ref 12.0–15.0)
Lymphocytes Relative: 42.6 % (ref 12.0–46.0)
Lymphs Abs: 1.5 10*3/uL (ref 0.7–4.0)
MCHC: 33.8 g/dL (ref 30.0–36.0)
MCV: 93.4 fl (ref 78.0–100.0)
Monocytes Absolute: 0.3 10*3/uL (ref 0.1–1.0)
Monocytes Relative: 9.4 % (ref 3.0–12.0)
Neutro Abs: 1.6 10*3/uL (ref 1.4–7.7)
Neutrophils Relative %: 46.5 % (ref 43.0–77.0)
Platelets: 187 10*3/uL (ref 150.0–400.0)
RBC: 4.42 Mil/uL (ref 3.87–5.11)
RDW: 13.8 % (ref 11.5–15.5)
WBC: 3.4 10*3/uL — ABNORMAL LOW (ref 4.0–10.5)

## 2019-05-20 LAB — HEPATIC FUNCTION PANEL
ALT: 10 U/L (ref 0–35)
AST: 15 U/L (ref 0–37)
Albumin: 4.4 g/dL (ref 3.5–5.2)
Alkaline Phosphatase: 50 U/L (ref 39–117)
Bilirubin, Direct: 0.1 mg/dL (ref 0.0–0.3)
Total Bilirubin: 0.6 mg/dL (ref 0.2–1.2)
Total Protein: 6.4 g/dL (ref 6.0–8.3)

## 2019-05-20 LAB — BASIC METABOLIC PANEL
BUN: 9 mg/dL (ref 6–23)
CO2: 29 mEq/L (ref 19–32)
Calcium: 9 mg/dL (ref 8.4–10.5)
Chloride: 103 mEq/L (ref 96–112)
Creatinine, Ser: 0.75 mg/dL (ref 0.40–1.20)
GFR: 77.63 mL/min (ref >60.00)
Glucose, Bld: 84 mg/dL (ref 70–99)
Potassium: 4 mEq/L (ref 3.5–5.1)
Sodium: 139 mEq/L (ref 135–145)

## 2019-05-20 LAB — TSH: TSH: 0.79 u[IU]/mL (ref 0.35–4.50)

## 2019-05-20 LAB — LIPID PANEL
Cholesterol: 239 mg/dL — ABNORMAL HIGH (ref 0–200)
HDL: 75.3 mg/dL (ref >39.00)
LDL Cholesterol: 150 mg/dL — ABNORMAL HIGH (ref 0–99)
NonHDL: 163.32
Total CHOL/HDL Ratio: 3
Triglycerides: 68 mg/dL (ref 0.0–149.0)
VLDL: 13.6 mg/dL (ref 0.0–40.0)

## 2019-05-20 NOTE — Progress Notes (Signed)
Subjective:    Patient ID: Kara Robinson, female    DOB: [birth year], 63 y.o.   MRN: [id number]  HPI   Patient presents today for complete physical.  Immunizations: up to date Diet: healthy Exercise: walks 3 miles a day, stationary bike Wt Readings from Last 3 Encounters:  05/20/19 147 lb (66.7 kg)  05/18/18 152 lb (68.9 kg)  04/20/18 151 lb (68.5 kg)  Colonoscopy:2014 Dexa: 2019 Pap [id number] Mammogram: 10/26 Vision: up to date Dental: up to date   Review of Systems  Constitutional: Negative for unexpected weight change.  HENT: Negative for hearing loss and rhinorrhea.   Eyes: Negative for visual disturbance.  Respiratory: Negative for cough and shortness of breath.   Cardiovascular: Negative for chest pain.  Gastrointestinal: Negative for blood in stool, constipation and diarrhea.  Genitourinary: Negative for dysuria and hematuria.  Musculoskeletal: Negative for arthralgias and myalgias.  Skin: Negative for rash.  Neurological: Negative for headaches.  Hematological: Negative for adenopathy.  Psychiatric/Behavioral:       Denies depression/anxiety   Past Medical History:  Diagnosis Date  . Allergy   . Anemia   . Arthritis   . Hyperlipidemia    Family Hx  . Thyroid disease      Social History   Socioeconomic History  . Marital status: Married    Spouse name: Not on file  . Number of children: Not on file  . Years of education: Not on file  . Highest education level: Not on file  Occupational History  . Not on file  Social Needs  . Financial resource strain: Not on file  . Food insecurity    Worry: Not on file    Inability: Not on file  . Transportation needs    Medical: Not on file    Non-medical: Not on file  Tobacco Use  . Smoking status: Never Smoker  . Smokeless tobacco: Never Used  Substance and Sexual Activity  . Alcohol use: Yes    Alcohol/week: 1.0 - 2.0 standard drinks    Types: 1 - 2 Glasses of wine per week  . Drug use:  Never  . Sexual activity: Not on file  Lifestyle  . Physical activity    Days per week: Not on file    Minutes per session: Not on file  . Stress: Not on file  Relationships  . Social Herbalist on phone: Not on file    Gets together: Not on file    Attends religious service: Not on file    Active member of club or organization: Not on file    Attends meetings of clubs or organizations: Not on file    Relationship status: Not on file  . Intimate partner violence    Fear of current or ex partner: Not on file    Emotionally abused: Not on file    Physically abused: Not on file    Forced sexual activity: Not on file  Other Topics Concern  . 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     Enjoys traveling, reading cooking, spending time with family   Married     Past Surgical History:  Procedure Laterality Date  . APPENDECTOMY    . BREAST EXCISIONAL BIOPSY Left    x 2  . BREAST LUMPECTOMY  WITH AXILLARY LYMPH NODE BIOPSY    . HERNIA REPAIR    . KNEE ARTHROSCOPY Left     Family History  Problem Relation Age of Onset  . Heart disease Mother        MI at age 63, smoker  . Hypertension Mother   . Kidney disease Father   . Kidney cancer Father   . Heart disease Sister        heart valve replacement--2016  . Diabetes type II Sister   . Other Sister        antisynthetase Syndrome    Allergies  Allergen Reactions  . Oxycodone Itching and Rash    Scratched herself until she bled    Current Outpatient Medications on File Prior to Visit  Medication Sig Dispense Refill  . Biotin 1 MG CAPS Take by mouth.    . cholecalciferol (VITAMIN D3) 25 MCG (1000 UT) tablet Take 1,000 Units by mouth daily.    . fexofenadine (ALLEGRA) 180 MG tablet 1 tablet    . fluticasone (FLONASE) 50 MCG/ACT nasal spray Place 2 sprays into both  nostrils daily. 16 g 6  . levothyroxine (SYNTHROID) 25 MCG tablet TAKE 1/2 TABLET(12.5 MCG) BY MOUTH DAILY 45 tablet 1   No current facility-administered medications on file prior to visit.     BP 129/69 (BP Location: Right Arm, Patient Position: Sitting, Cuff Size: Small)   Pulse 67   Temp (!) 96 F (35.6 C) (Temporal)   Resp 16   Ht 5' 4.5" (1.638 m)   Wt 147 lb (66.7 kg)   SpO2 100%   BMI 24.84 kg/m       Objective:   Physical Exam Physical Exam  Constitutional: She is oriented to person, place, and time. She appears well-developed and well-nourished. No distress.  HENT:  Head: Normocephalic and atraumatic.  Right Ear: Tympanic membrane and ear canal normal.  Left Ear: Tympanic membrane and ear canal normal.  Mouth/Throat: Oropharynx is clear and moist.  Eyes: Pupils are equal, round, and reactive to light. No scleral icterus.  Neck: Normal range of motion. No thyromegaly present.  Cardiovascular: Normal rate and regular rhythm.   No murmur heard. Pulmonary/Chest: Effort normal and breath sounds normal. No respiratory distress. He has no wheezes. She has no rales. She exhibits no tenderness.  Abdominal: Soft. Bowel sounds are normal. She exhibits no distension and no mass. There is no tenderness. There is no rebound and no guarding.  Musculoskeletal: She exhibits no edema.  Lymphadenopathy:    She has no cervical adenopathy.  Neurological: She is alert and oriented to person, place, and time. She has normal patellar reflexes. She exhibits normal muscle tone. Coordination normal.  Skin: Skin is warm and dry.  Psychiatric: She has a normal mood and affect. Her behavior is normal. Judgment and thought content normal.  Breasts: Examined lying Right: Without masses, retractions, discharge or axillary adenopathy.  Left: Without masses, retractions, discharge or axillary adenopathy.  Inguinal/mons: Normal without inguinal adenopathy  External genitalia: Normal   BUS/Urethra/Skene's glands: Normal- except + urethral prolaps Bladder: Normal  Vagina: Normal  Cervix: Normal  Uterus: normal in size, shape and contour. Midline and mobile  Adnexa/parametria:  Rt: Without masses or tenderness.  Lt: Without masses or tenderness.  Anus and perineum: Normal            Assessment & Plan:   Preventative care- encouraged pt to continue healthy diet, exercise.  Will obtain routine lab work including follow up TSH.  Pap smear performed today.     Assessment & Plan:

## 2019-05-20 NOTE — Patient Instructions (Signed)
Please complete lab work prior to leaving. Continue healthy diet, exercise.  

## 2019-05-25 LAB — CYTOLOGY - PAP
Comment: NEGATIVE
Diagnosis: NEGATIVE
High risk HPV: NEGATIVE

## 2019-05-26 ENCOUNTER — Encounter: Payer: Self-pay | Admitting: Family

## 2019-09-26 ENCOUNTER — Encounter: Payer: Self-pay | Admitting: Family

## 2019-10-05 ENCOUNTER — Ambulatory Visit: Payer: Managed Care, Other (non HMO) | Admitting: Family

## 2019-11-25 ENCOUNTER — Telehealth: Payer: Self-pay | Admitting: Family

## 2019-11-25 ENCOUNTER — Other Ambulatory Visit: Payer: Self-pay

## 2019-11-25 MED ORDER — LEVOTHYROXINE SODIUM 25 MCG PO TABS: ORAL_TABLET | ORAL | 1 refills | Status: DC

## 2019-11-25 NOTE — Telephone Encounter (Signed)
Medication:levothyroxine (SYNTHROID) 25 MCG tablet      Has the patient contacted their pharmacy? yes (If no, request that the patient contact the pharmacy for the refill.) (If yes, when and what did the pharmacy advise?) no responses from doctor     Preferred Pharmacy (with phone number or street name): [id number] - [place] - [address]  [address] [place], [place] [address]  Phone:  [phone] Fax:  [phone]      Agent: Please be advised that RX refills may take up to 3 business days. We ask that you follow-up with your pharmacy.

## 2020-01-11 DIAGNOSIS — D225 Melanocytic nevi of trunk: Secondary | ICD-10-CM | POA: Diagnosis not present

## 2020-01-11 DIAGNOSIS — L821 Other seborrheic keratosis: Secondary | ICD-10-CM | POA: Diagnosis not present

## 2020-01-11 DIAGNOSIS — L578 Other skin changes due to chronic exposure to nonionizing radiation: Secondary | ICD-10-CM | POA: Diagnosis not present

## 2020-01-11 DIAGNOSIS — L814 Other melanin hyperpigmentation: Secondary | ICD-10-CM | POA: Diagnosis not present

## 2020-01-13 ENCOUNTER — Encounter: Payer: Self-pay | Admitting: Family

## 2020-01-13 MED ORDER — TRAZODONE HCL 50 MG PO TABS: 25.0000 mg | ORAL_TABLET | Freq: Every evening | ORAL | 1 refills | Status: DC | PRN

## 2020-02-25 ENCOUNTER — Encounter: Payer: Self-pay | Admitting: Family

## 2020-02-27 MED ORDER — LEVOTHYROXINE SODIUM 25 MCG PO TABS: 12.5000 ug | ORAL_TABLET | Freq: Every day | ORAL | 0 refills | Status: DC

## 2020-02-29 ENCOUNTER — Telehealth (INDEPENDENT_AMBULATORY_CARE_PROVIDER_SITE_OTHER): Payer: PPO | Admitting: Family

## 2020-02-29 ENCOUNTER — Encounter: Payer: Self-pay | Admitting: Family

## 2020-02-29 DIAGNOSIS — H6692 Otitis media, unspecified, left ear: Secondary | ICD-10-CM

## 2020-02-29 DIAGNOSIS — J0100 Acute maxillary sinusitis, unspecified: Secondary | ICD-10-CM

## 2020-02-29 MED ORDER — AMOXICILLIN-POT CLAVULANATE 875-125 MG PO TABS: 1.0000 | ORAL_TABLET | Freq: Two times a day (BID) | ORAL | 0 refills | Status: DC

## 2020-02-29 MED ORDER — NEOMYCIN-POLYMYXIN-HC 1 % OT SOLN: 3.0000 [drp] | Freq: Four times a day (QID) | OTIC | 0 refills | Status: AC

## 2020-02-29 NOTE — Progress Notes (Signed)
Virtual Visit via Video Note  I connected with [place] on 02/29/20 at 10:40 AM EDT by a video enabled telemedicine application and verified that I am speaking with the correct person using two identifiers.  Location: Patient: home Provider: work   I discussed the limitations of evaluation and management by telemedicine and the availability of in person appointments. The patient expressed understanding and agreed to proceed. Only the patient and myself were present for today's video call.   History of Present Illness:  Patient is a 64 yr old female who presents today with chief complaint of left sided ear pain.  Reports that she has had worsening post-nasal drip and now is having left sided cheek pain and left ear pain.  She continues her flonase and otc antihistamine.  Most recently she has been using sudafed with minimal relief of her symptoms. She denies fever. Symptoms began about 1 week ago.    Observations/Objective:   Gen: Awake, alert, no acute distress Resp: Breathing is even and non-labored ENT:  +left ear tenderness with pressure on tragus or with movement of pinna + left maxillary sinus tenderness to palpation Psych: calm/pleasant demeanor Neuro: Alert and Oriented x 3, + facial symmetry, speech is clear.   Assessment and Plan:  Sinusitis/Otitis media- will rx with augmentin.  Will also add cortisporin drops to cover empirically for otitis externa.  Pt is advised to call if symptoms worsen or if she has not seen some improvement in 3 days.  Pt verbalizes understanding.  Follow Up Instructions:    I discussed the assessment and treatment plan with the patient. The patient was provided an opportunity to ask questions and all were answered. The patient agreed with the plan and demonstrated an understanding of the instructions.   The patient was advised to call back or seek an in-person evaluation if the symptoms worsen or if the condition fails to improve as  anticipated.  [name], NP

## 2020-03-02 ENCOUNTER — Telehealth: Payer: Managed Care, Other (non HMO) | Admitting: Family

## 2020-04-12 ENCOUNTER — Other Ambulatory Visit: Payer: Self-pay | Admitting: Family

## 2020-04-12 DIAGNOSIS — Z1231 Encounter for screening mammogram for malignant neoplasm of breast: Secondary | ICD-10-CM

## 2020-05-04 ENCOUNTER — Ambulatory Visit: Payer: PPO

## 2020-05-08 ENCOUNTER — Other Ambulatory Visit: Payer: Self-pay

## 2020-05-08 ENCOUNTER — Ambulatory Visit
Admission: RE | Admit: 2020-05-08 | Discharge: 2020-05-08 | Disposition: A | Discharge status: Home / Self Care | Payer: PPO | Source: Ambulatory Visit | Attending: Family | Admitting: Family

## 2020-05-08 DIAGNOSIS — Z1231 Encounter for screening mammogram for malignant neoplasm of breast: Secondary | ICD-10-CM | POA: Diagnosis not present

## 2020-05-09 ENCOUNTER — Telehealth (INDEPENDENT_AMBULATORY_CARE_PROVIDER_SITE_OTHER): Payer: PPO | Admitting: Family Medicine

## 2020-05-09 ENCOUNTER — Encounter: Payer: Self-pay | Admitting: Family Medicine

## 2020-05-09 VITALS — Temp 97.6°F | Ht 64.5 in

## 2020-05-09 DIAGNOSIS — J40 Bronchitis, not specified as acute or chronic: Secondary | ICD-10-CM

## 2020-05-09 DIAGNOSIS — J0140 Acute pansinusitis, unspecified: Secondary | ICD-10-CM

## 2020-05-09 MED ORDER — DOXYCYCLINE HYCLATE 100 MG PO TABS: 100.0000 mg | ORAL_TABLET | Freq: Two times a day (BID) | ORAL | 0 refills | Status: DC

## 2020-05-09 MED ORDER — BENZONATATE 100 MG PO CAPS: 200.0000 mg | ORAL_CAPSULE | Freq: Three times a day (TID) | ORAL | 0 refills | Status: DC | PRN

## 2020-05-09 NOTE — Progress Notes (Signed)
Virtual Visit via Video Note  I connected with Kara Robinson on 05/09/20 at 10:00 AM EDT by a video enabled telemedicine application and verified that I am speaking with the correct person using two identifiers.  Location: Patient: home with husband  Provider: home    I discussed the limitations of evaluation and management by telemedicine and the availability of in person appointments. The patient expressed understanding and agreed to proceed.  History of Present Illness: Pt is home with her husband c/o congestion, st and ear pain -- + productive cough since sat     She is taking mucinex and has had no fever    Cough is keeping her awake  + nasal congestion and sinus pressure  Observations/Objective: Vitals:   05/09/20 1003  Temp: 97.6 F (36.4 C)   Pt is in nad  No sob   Assessment and Plan: 1. Acute non-recurrent pansinusitis Tessalon to help stop cough at night  Can use mucinex -- make sure you are drinking plenty of water  abx per orders  - benzonatate (TESSALON PERLES) 100 MG capsule; Take 2 capsules (200 mg total) by mouth 3 (three) times daily as needed for cough.  Dispense: 40 capsule; Refill: 0 - doxycycline (VIBRA-TABS) 100 MG tablet; Take 1 tablet (100 mg total) by mouth 2 (two) times daily.  Dispense: 20 tablet; Refill: 0  2. Bronchitis Cough med and abx per orders Get covid test-- if neg and not improving can be seen in office if needed - benzonatate (TESSALON PERLES) 100 MG capsule; Take 2 capsules (200 mg total) by mouth 3 (three) times daily as needed for cough.  Dispense: 40 capsule; Refill: 0 - doxycycline (VIBRA-TABS) 100 MG tablet; Take 1 tablet (100 mg total) by mouth 2 (two) times daily.  Dispense: 20 tablet; Refill: 0   Follow Up Instructions:    I discussed the assessment and treatment plan with the patient. The patient was provided an opportunity to ask questions and all were answered. The patient agreed with the plan and demonstrated an  understanding of the instructions.   The patient was advised to call back or seek an in-person evaluation if the symptoms worsen or if the condition fails to improve as anticipated.  I provided 25 minutes of non-face-to-face time during this encounter.   Ann Held, DO

## 2020-05-15 ENCOUNTER — Other Ambulatory Visit (HOSPITAL_BASED_OUTPATIENT_CLINIC_OR_DEPARTMENT_OTHER): Payer: Self-pay | Admitting: Internal Medicine

## 2020-05-15 ENCOUNTER — Ambulatory Visit: Payer: PPO | Attending: Internal Medicine

## 2020-05-15 DIAGNOSIS — Z23 Encounter for immunization: Secondary | ICD-10-CM

## 2020-05-15 MED FILL — FLUAD QUADRIVALENT 0.5 ML P: 0.5 | 1 days supply | Qty: 1 | Fill #0

## 2020-05-18 MED FILL — PFIZER-BIONTECH COVID-19 VA: 30 | 1 days supply | Qty: 0 | Fill #0

## 2020-05-22 ENCOUNTER — Encounter: Payer: Managed Care, Other (non HMO) | Admitting: Family

## 2020-05-28 ENCOUNTER — Encounter: Payer: Self-pay | Admitting: Family

## 2020-05-29 ENCOUNTER — Other Ambulatory Visit: Payer: Self-pay

## 2020-05-29 MED ORDER — LEVOTHYROXINE SODIUM 25 MCG PO TABS: 12.5000 ug | ORAL_TABLET | Freq: Every day | ORAL | 0 refills | Status: DC

## 2020-06-20 ENCOUNTER — Other Ambulatory Visit: Payer: Self-pay | Admitting: Family

## 2020-06-20 ENCOUNTER — Ambulatory Visit (INDEPENDENT_AMBULATORY_CARE_PROVIDER_SITE_OTHER): Payer: PPO | Admitting: Family

## 2020-06-20 ENCOUNTER — Other Ambulatory Visit: Payer: Self-pay

## 2020-06-20 ENCOUNTER — Encounter: Payer: Self-pay | Admitting: Family

## 2020-06-20 VITALS — BP 126/62 | HR 76 | Ht 64.5 in | Wt 151.0 lb

## 2020-06-20 DIAGNOSIS — E039 Hypothyroidism, unspecified: Secondary | ICD-10-CM | POA: Diagnosis not present

## 2020-06-20 DIAGNOSIS — G4700 Insomnia, unspecified: Secondary | ICD-10-CM | POA: Diagnosis not present

## 2020-06-20 DIAGNOSIS — E785 Hyperlipidemia, unspecified: Secondary | ICD-10-CM | POA: Diagnosis not present

## 2020-06-20 DIAGNOSIS — Z23 Encounter for immunization: Secondary | ICD-10-CM | POA: Diagnosis not present

## 2020-06-20 DIAGNOSIS — Z0000 Encounter for general adult medical examination without abnormal findings: Secondary | ICD-10-CM | POA: Diagnosis not present

## 2020-06-20 DIAGNOSIS — J302 Other seasonal allergic rhinitis: Secondary | ICD-10-CM

## 2020-06-20 LAB — TSH: TSH: 1.03 u[IU]/mL (ref 0.35–4.50)

## 2020-06-20 LAB — COMPREHENSIVE METABOLIC PANEL
ALT: 10 U/L (ref 0–35)
AST: 12 U/L (ref 0–37)
Albumin: 4.3 g/dL (ref 3.5–5.2)
Alkaline Phosphatase: 50 U/L (ref 39–117)
BUN: 16 mg/dL (ref 6–23)
CO2: 29 mEq/L (ref 19–32)
Calcium: 9 mg/dL (ref 8.4–10.5)
Chloride: 103 mEq/L (ref 96–112)
Creatinine, Ser: 0.71 mg/dL (ref 0.40–1.20)
GFR: 89.01 mL/min (ref >60.00)
Glucose, Bld: 84 mg/dL (ref 70–99)
Potassium: 4.3 mEq/L (ref 3.5–5.1)
Sodium: 139 mEq/L (ref 135–145)
Total Bilirubin: 0.4 mg/dL (ref 0.2–1.2)
Total Protein: 6.4 g/dL (ref 6.0–8.3)

## 2020-06-20 LAB — LIPID PANEL
Cholesterol: 225 mg/dL — ABNORMAL HIGH (ref 0–200)
HDL: 91.2 mg/dL (ref >39.00)
LDL Cholesterol: 124 mg/dL — ABNORMAL HIGH (ref 0–99)
NonHDL: 133.92
Total CHOL/HDL Ratio: 2
Triglycerides: 51 mg/dL (ref 0.0–149.0)
VLDL: 10.2 mg/dL (ref 0.0–40.0)

## 2020-06-20 MED ORDER — TRAZODONE HCL 50 MG PO TABS: 25.0000 mg | ORAL_TABLET | Freq: Every evening | ORAL | 1 refills | Status: DC | PRN

## 2020-06-20 NOTE — Patient Instructions (Signed)
Please complete lab work prior to leaving.    Preventive Care 24-65 Years Old, Female Preventive care refers to visits with your health care provider and lifestyle choices that can promote health and wellness. This includes:  A yearly physical exam. This may also be called an annual well check.  Regular dental visits and eye exams.  Immunizations.  Screening for certain conditions.  Healthy lifestyle choices, such as eating a healthy diet, getting regular exercise, not using drugs or products that contain nicotine and tobacco, and limiting alcohol use. What can I expect for my preventive care visit? Physical exam Your health care provider will check your:  Height and weight. This may be used to calculate body mass index (BMI), which tells if you are at a healthy weight.  Heart rate and blood pressure.  Skin for abnormal spots. Counseling Your health care provider may ask you questions about your:  Alcohol, tobacco, and drug use.  Emotional well-being.  Home and relationship well-being.  Sexual activity.  Eating habits.  Work and work Statistician.  Method of birth control.  Menstrual cycle.  Pregnancy history. What immunizations do I need?  Influenza (flu) vaccine  This is recommended every year. Tetanus, diphtheria, and pertussis (Tdap) vaccine  You may need a Td booster every 10 years. Varicella (chickenpox) vaccine  You may need this if you have not been vaccinated. Zoster (shingles) vaccine  You may need this after age 32. Measles, mumps, and rubella (MMR) vaccine  You may need at least one dose of MMR if you were born in 1957 or later. You may also need a second dose. Pneumococcal conjugate (PCV13) vaccine  You may need this if you have certain conditions and were not previously vaccinated. Pneumococcal polysaccharide (PPSV23) vaccine  You may need one or two doses if you smoke cigarettes or if you have certain conditions. Meningococcal conjugate  (MenACWY) vaccine  You may need this if you have certain conditions. Hepatitis A vaccine  You may need this if you have certain conditions or if you travel or work in places where you may be exposed to hepatitis A. Hepatitis B vaccine  You may need this if you have certain conditions or if you travel or work in places where you may be exposed to hepatitis B. Haemophilus influenzae type b (Hib) vaccine  You may need this if you have certain conditions. Human papillomavirus (HPV) vaccine  If recommended by your health care provider, you may need three doses over 6 months. You may receive vaccines as individual doses or as more than one vaccine together in one shot (combination vaccines). Talk with your health care provider about the risks and benefits of combination vaccines. What tests do I need? Blood tests  Lipid and cholesterol levels. These may be checked every 5 years, or more frequently if you are over 72 years old.  Hepatitis C test.  Hepatitis B test. Screening  Lung cancer screening. You may have this screening every year starting at age 24 if you have a 30-pack-year history of smoking and currently smoke or have quit within the past 15 years.  Colorectal cancer screening. All adults should have this screening starting at age 44 and continuing until age 17. Your health care provider may recommend screening at age 66 if you are at increased risk. You will have tests every 1-10 years, depending on your results and the type of screening test.  Diabetes screening. This is done by checking your blood sugar (glucose) after you have  not eaten for a while (fasting). You may have this done every 1-3 years.  Mammogram. This may be done every 1-2 years. Talk with your health care provider about when you should start having regular mammograms. This may depend on whether you have a family history of breast cancer.  BRCA-related cancer screening. This may be done if you have a family  history of breast, ovarian, tubal, or peritoneal cancers.  Pelvic exam and Pap test. This may be done every 3 years starting at age 62. Starting at age 39, this may be done every 5 years if you have a Pap test in combination with an HPV test. Other tests  Sexually transmitted disease (STD) testing.  Bone density scan. This is done to screen for osteoporosis. You may have this scan if you are at high risk for osteoporosis. Follow these instructions at home: Eating and drinking  Eat a diet that includes fresh fruits and vegetables, whole grains, lean protein, and low-fat dairy.  Take vitamin and mineral supplements as recommended by your health care provider.  Do not drink alcohol if: ? Your health care provider tells you not to drink. ? You are pregnant, may be pregnant, or are planning to become pregnant.  If you drink alcohol: ? Limit how much you have to 0-1 drink a day. ? Be aware of how much alcohol is in your drink. In the [place] one drink equals one 12 oz bottle of beer (355 mL), one 5 oz glass of wine (148 mL), or one 1 oz glass of hard liquor (44 mL). Lifestyle  Take daily care of your teeth and gums.  Stay active. Exercise for at least 30 minutes on 5 or more days each week.  Do not use any products that contain nicotine or tobacco, such as cigarettes, e-cigarettes, and chewing tobacco. If you need help quitting, ask your health care provider.  If you are sexually active, practice safe sex. Use a condom or other form of birth control (contraception) in order to prevent pregnancy and STIs (sexually transmitted infections).  If told by your health care provider, take low-dose aspirin daily starting at age 13. What's next?  Visit your health care provider once a year for a well check visit.  Ask your health care provider how often you should have your eyes and teeth checked.  Stay up to date on all vaccines. This information is not intended to replace advice given to you  by your health care provider. Make sure you discuss any questions you have with your health care provider. Document Revised: 03/04/2018 Document Reviewed: 03/04/2018 Elsevier Patient Education  2020 [employer].

## 2020-06-20 NOTE — Progress Notes (Signed)
Subjective:    Patient ID: Kara Robinson, female    DOB: 08-15-1955, 64 y.o.   MRN: [id number]  HPI  Patient presents today for complete physical.  Immunizations: Completed pfizer x 3, shingrix x2, flu shot.  Tdap 2018.  Due for pneumovax 23.  Diet:trying to improve her diet Exercise: planning to get on track  Wt Readings from Last 3 Encounters:  06/20/20 151 lb (68.5 kg)  05/20/19 147 lb (66.7 kg)  05/18/18 152 lb (68.9 kg)  Colonoscopy: 2014 Dexa: 2019 Pap Smear: 05/20/19 Mammogram:05/08/20 Vision: due, plans to schedule Dental:up to date  Hypothyroid- maintained on synthroid 12.103mcg.  Feels well on this dose.   Lab Results  Component Value Date   TSH 0.79 05/20/2019   Insomnia- uses trazodone 25-50mg  once daily at night. Reports that she is sleeping well.  Hyperlipidemia- Lab Results  Component Value Date   CHOL 239 (H) 05/20/2019   HDL 75.30 05/20/2019   LDLCALC 150 (H) 05/20/2019   TRIG 68.0 05/20/2019   CHOLHDL 3 05/20/2019   Seasonal allergies- uses allegra and flonase year round- stable.   Review of Systems  Constitutional: Negative for unexpected weight change.  HENT: Negative for hearing loss and rhinorrhea.   Eyes: Negative for visual disturbance.  Respiratory: Negative for cough and shortness of breath.   Cardiovascular: Negative for chest pain.  Gastrointestinal: Negative for constipation and diarrhea.  Genitourinary: Negative for dysuria and frequency.  Musculoskeletal: Positive for arthralgias (hands).  Skin: Negative for rash.  Neurological: Negative for headaches.  Hematological: Negative for adenopathy.  Psychiatric/Behavioral:       Denies depression    Past Medical History:  Diagnosis Date  . Allergy   . Anemia   . Arthritis   . Hyperlipidemia    Family Hx  . Thyroid disease      Social History   Socioeconomic History  . Marital status: Married    Spouse name: Not on file  . Number of children: Not on file  . Years of  education: Not on file  . Highest education level: Not on file  Occupational History  . Not on file  Tobacco Use  . Smoking status: Never Smoker  . Smokeless tobacco: Never Used  Vaping Use  . Vaping Use: Never used  Substance and Sexual Activity  . Alcohol use: Yes    Alcohol/week: 1.0 - 2.0 standard drink    Types: 1 - 2 Glasses of wine per week  . Drug use: Never  . Sexual activity: Not on file  Other Topics Concern  . 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     Enjoys traveling, reading cooking, spending time with family   Married    Social Determinants of Radio broadcast assistant Strain: Not on file  Food Insecurity: Not on file  Transportation Needs: Not on file  Physical Activity: Not on file  Stress: Not on file  Social Connections: Not on file  Intimate Partner Violence: Not on file    Past Surgical History:  Procedure Laterality Date  . APPENDECTOMY    . BREAST EXCISIONAL BIOPSY Left    x 2  . BREAST LUMPECTOMY WITH AXILLARY LYMPH NODE BIOPSY    . HERNIA REPAIR    . KNEE ARTHROSCOPY Left     Family History  Problem Relation Age of Onset  . Heart disease Mother        MI at age 75, smoker  . Hypertension Mother   . Kidney disease Father   . Kidney cancer Father   . Heart disease Sister        heart valve replacement--2016  . Diabetes type II Sister   . Other Sister        antisynthetase Syndrome  . Heart attack Sister     Allergies  Allergen Reactions  . Oxycodone Itching and Rash    Scratched herself until she bled  . Penicillins Rash    Current Outpatient Medications on File Prior to Visit  Medication Sig Dispense Refill  . Biotin 1 MG CAPS Take by mouth.    . cholecalciferol (VITAMIN D3) 25 MCG (1000 UT) tablet Take 1,000 Units by mouth daily.    . fexofenadine (ALLEGRA) 180 MG tablet 1  tablet    . fluticasone (FLONASE) 50 MCG/ACT nasal spray Place 2 sprays into both nostrils daily. 16 g 6  . levothyroxine (SYNTHROID) 25 MCG tablet Take 0.5 tablets (12.5 mcg total) by mouth daily before breakfast. 45 tablet 0  . traZODone (DESYREL) 50 MG tablet Take 0.5-1 tablets (25-50 mg total) by mouth at bedtime as needed for sleep. 30 tablet 1   No current facility-administered medications on file prior to visit.    BP 126/62 (BP Location: Left Arm, Patient Position: Sitting, Cuff Size: Normal)   Pulse 76   Ht 5' 4.5" (1.638 m)   Wt 151 lb (68.5 kg)   SpO2 98%   BMI 25.52 kg/m    Objective:   Physical Exam Physical Exam  Constitutional: She is oriented to person, place, and time. She appears well-developed and well-nourished. No distress.  HENT:  Head: Normocephalic and atraumatic.  Right Ear: Tympanic membrane and ear canal normal.  Left Ear: Tympanic membrane and ear canal normal.  Mouth/Throat: Not examined, pt wearing a mask Eyes: Pupils are equal, round, and reactive to light. No scleral icterus.  Neck: Normal range of motion. No thyromegaly present.  Cardiovascular: Normal rate and regular rhythm.   No murmur heard. Pulmonary/Chest: Effort normal and breath sounds normal. No respiratory distress. He has no wheezes. She has no rales. She exhibits no tenderness.  Abdominal: Soft. Bowel sounds are normal. She exhibits no distension and no mass. There is no tenderness. There is no rebound and no guarding.  Musculoskeletal: She exhibits no edema.  Lymphadenopathy:    She has no cervical adenopathy.  Neurological: She is alert and oriented to person, place, and time. She has normal patellar reflexes. She exhibits normal muscle tone. Coordination normal.  Skin: Skin is warm and dry.  Psychiatric: She has a normal mood and affect. Her behavior is normal. Judgment and thought content normal.  Breast/pelvic: deferred Assessment & Plan:   Preventative care- mammo/pap/colo  all up to date.  Immunizations reviewed- due for pneumovax 23 today.    Hypothyroid- clinically stable on synthroid 12.80mcg, continue same.  Check follow up TSH.  Hyperlipidemia- obtain follow up lipid panel.  Insomnia- stable on trazodone 50mg  HS.    Allergies- stable on flonase daily and allegra 180mg  daily otc.  Continue same.   This visit occurred during the SARS-CoV-2 public health emergency.  Safety protocols were in place, including screening questions prior to the visit, additional usage of staff PPE, and extensive cleaning of exam room while observing appropriate contact time as indicated for disinfecting solutions.  Assessment & Plan:

## 2020-10-09 ENCOUNTER — Encounter: Payer: Self-pay | Admitting: Family

## 2020-10-09 ENCOUNTER — Ambulatory Visit (INDEPENDENT_AMBULATORY_CARE_PROVIDER_SITE_OTHER): Payer: PPO | Admitting: Family

## 2020-10-09 ENCOUNTER — Other Ambulatory Visit: Payer: Self-pay

## 2020-10-09 VITALS — BP 129/59 | HR 72 | Temp 97.8°F | Resp 16 | Ht 64.5 in | Wt 145.0 lb

## 2020-10-09 DIAGNOSIS — J0140 Acute pansinusitis, unspecified: Secondary | ICD-10-CM

## 2020-10-09 DIAGNOSIS — J302 Other seasonal allergic rhinitis: Secondary | ICD-10-CM | POA: Diagnosis not present

## 2020-10-09 MED ORDER — MONTELUKAST SODIUM 10 MG PO TABS: 10.0000 mg | ORAL_TABLET | Freq: Every day | ORAL | 3 refills | Status: DC

## 2020-10-09 MED ORDER — DOXYCYCLINE HYCLATE 100 MG PO TABS: 100.0000 mg | ORAL_TABLET | Freq: Two times a day (BID) | ORAL | 0 refills | Status: DC

## 2020-10-09 NOTE — Progress Notes (Signed)
Subjective:    Patient ID: Kara Robinson, female    DOB: April 14, 1956, 65 y.o.   MRN: [id number]  HPI  Patient is a 65 yr old female who presents today with several symptoms. Reports that she has been having bilateral ear pain L>R and sore throat x 1 week.  Reports that she has developed HA the last couple of days with  "teeth hurting." She also reports that her allergies are "killing me" this year.  She is using allegra, flonase without significant improvement. She denies known fever but has not checked.     Review of Systems    see HPI  Past Medical History:  Diagnosis Date  . Allergy   . Anemia   . Arthritis   . Hyperlipidemia    Family Hx  . Thyroid disease      Social History   Socioeconomic History  . Marital status: Married    Spouse name: Not on file  . Number of children: Not on file  . Years of education: Not on file  . Highest education level: Not on file  Occupational History  . Not on file  Tobacco Use  . Smoking status: Never Smoker  . Smokeless tobacco: Never Used  Vaping Use  . Vaping Use: Never used  Substance and Sexual Activity  . Alcohol use: Yes    Alcohol/week: 1.0 - 2.0 standard drink    Types: 1 - 2 Glasses of wine per week  . Drug use: Never  . Sexual activity: Not on file  Other Topics Concern  . 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     Enjoys traveling, reading cooking, spending time with family   Married    Social Determinants of Radio broadcast assistant Strain: Not on file  Food Insecurity: Not on file  Transportation Needs: Not on file  Physical Activity: Not on file  Stress: Not on file  Social Connections: Not on file  Intimate Partner Violence: Not on file    Past Surgical History:  Procedure Laterality Date  . APPENDECTOMY    . BREAST EXCISIONAL BIOPSY Left     x 2  . BREAST LUMPECTOMY WITH AXILLARY LYMPH NODE BIOPSY    . HERNIA REPAIR    . KNEE ARTHROSCOPY Left     Family History  Problem Relation Age of Onset  . Heart disease Mother        MI at age 57, smoker  . Hypertension Mother   . Kidney disease Father   . Kidney cancer Father   . Heart disease Sister        heart valve replacement--2016  . Diabetes type II Sister   . Other Sister        antisynthetase Syndrome  . Heart attack Sister     Allergies  Allergen Reactions  . Oxycodone Itching and Rash    Scratched herself until she bled  . Penicillins Rash    Augmentin    Current Outpatient Medications on File Prior to Visit  Medication Sig Dispense Refill  . Biotin 1 MG CAPS Take by mouth.    . cholecalciferol (VITAMIN D3) 25 MCG (1000 UT) tablet Take 1,000 Units by mouth daily.    Marland Kitchen COVID-19 mRNA vaccine, Pfizer, 30 MCG/0.3ML injection INJECT AS DIRECTED .3 mL 0  .  fexofenadine (ALLEGRA) 180 MG tablet 1 tablet    . fluticasone (FLONASE) 50 MCG/ACT nasal spray Place 2 sprays into both nostrils daily. 16 g 6  . influenza vaccine adjuvanted (FLUAD) 0.5 ML injection INJECT AS DIRECTED .5 mL 0  . levothyroxine (SYNTHROID) 25 MCG tablet Take 0.5 tablets (12.5 mcg total) by mouth daily before breakfast. 45 tablet 0  . traZODone (DESYREL) 50 MG tablet Take 0.5-1 tablets (25-50 mg total) by mouth at bedtime as needed for sleep. 90 tablet 1   No current facility-administered medications on file prior to visit.    BP (!) 129/59 (BP Location: Right Arm, Patient Position: Sitting, Cuff Size: Small)   Pulse 72   Temp 97.8 F (36.6 C) (Temporal)   Resp 16   Ht 5' 4.5" (1.638 m)   Wt 145 lb (65.8 kg)   SpO2 100%   BMI 24.50 kg/m    Objective:   Physical Exam Constitutional:      Appearance: She is well-developed.  HENT:     Right Ear: Ear canal normal. No drainage. Tympanic membrane is retracted.     Left Ear: Tympanic membrane and ear canal normal. No drainage.     Nose:      Right Sinus: Maxillary sinus tenderness and frontal sinus tenderness present.     Left Sinus: Maxillary sinus tenderness and frontal sinus tenderness present.     Mouth/Throat:     Pharynx: Posterior oropharyngeal erythema present. No pharyngeal swelling, oropharyngeal exudate or uvula swelling.  Neck:     Thyroid: No thyromegaly.  Cardiovascular:     Rate and Rhythm: Normal rate and regular rhythm.     Heart sounds: Normal heart sounds. No murmur heard.   Pulmonary:     Effort: Pulmonary effort is normal. No respiratory distress.     Breath sounds: Normal breath sounds. No wheezing.  Musculoskeletal:     Cervical back: Neck supple.  Skin:    General: Skin is warm and dry.  Neurological:     Mental Status: She is alert and oriented to person, place, and time.  Psychiatric:        Behavior: Behavior normal.        Thought Content: Thought content normal.        Judgment: Judgment normal.           Assessment & Plan:  Sinusitis- new.  Will rx with doxycycline 100mg  bid. (has pen allergy).  Seasonal allergies- uncontrolled despite flonase and allegra. Recommended addition of singulair 10mg  once daily.   This visit occurred during the SARS-CoV-2 public health emergency.  Safety protocols were in place, including screening questions prior to the visit, additional usage of staff PPE, and extensive cleaning of exam room while observing appropriate contact time as indicated for disinfecting solutions.

## 2020-10-09 NOTE — Patient Instructions (Signed)
Please begin doxycycline 100mg  twice daily for sinus infection.  Add singulair 10mg  once daily for allergies. Call if symptoms worsen or if not improved in 3-4 days.

## 2020-11-22 ENCOUNTER — Other Ambulatory Visit: Payer: Self-pay | Admitting: Family

## 2020-12-15 ENCOUNTER — Other Ambulatory Visit: Payer: Self-pay | Admitting: Family

## 2020-12-21 ENCOUNTER — Ambulatory Visit: Payer: PPO | Attending: Internal Medicine

## 2020-12-21 ENCOUNTER — Other Ambulatory Visit: Payer: Self-pay

## 2020-12-21 ENCOUNTER — Encounter: Payer: Self-pay | Admitting: Family

## 2020-12-21 ENCOUNTER — Ambulatory Visit (INDEPENDENT_AMBULATORY_CARE_PROVIDER_SITE_OTHER): Payer: PPO | Admitting: Family

## 2020-12-21 VITALS — BP 140/69 | HR 71 | Temp 98.3°F | Resp 16 | Ht 64.5 in | Wt 137.0 lb

## 2020-12-21 DIAGNOSIS — E039 Hypothyroidism, unspecified: Secondary | ICD-10-CM | POA: Diagnosis not present

## 2020-12-21 DIAGNOSIS — J302 Other seasonal allergic rhinitis: Secondary | ICD-10-CM

## 2020-12-21 DIAGNOSIS — G4700 Insomnia, unspecified: Secondary | ICD-10-CM | POA: Insufficient documentation

## 2020-12-21 DIAGNOSIS — E785 Hyperlipidemia, unspecified: Secondary | ICD-10-CM | POA: Diagnosis not present

## 2020-12-21 DIAGNOSIS — Z23 Encounter for immunization: Secondary | ICD-10-CM

## 2020-12-21 LAB — LIPID PANEL
Cholesterol: 242 mg/dL — ABNORMAL HIGH (ref 0–200)
HDL: 101.4 mg/dL (ref >39.00)
LDL Cholesterol: 129 mg/dL — ABNORMAL HIGH (ref 0–99)
NonHDL: 141.04
Total CHOL/HDL Ratio: 2
Triglycerides: 58 mg/dL (ref 0.0–149.0)
VLDL: 11.6 mg/dL (ref 0.0–40.0)

## 2020-12-21 LAB — TSH: TSH: 0.94 u[IU]/mL (ref 0.35–4.50)

## 2020-12-21 MED ORDER — MONTELUKAST SODIUM 10 MG PO TABS: 10.0000 mg | ORAL_TABLET | Freq: Every day | ORAL | 1 refills | Status: DC

## 2020-12-21 NOTE — Progress Notes (Signed)
Subjective:   By signing my name below, I, Shehryar Baig, attest that this documentation has been prepared under the direction and in the presence of Debbrah Alar NP. 12/21/2020    Patient ID: Kara Robinson, female    DOB: [birth year], 65 y.o.   MRN: [id number]  No chief complaint on file.   HPI Patient is in today for a office visit.  Seasonal allergies- notes singnificant improvement since we added 10 mg Singulair daily PO.  She does not yet have the second Covid-19 booster vaccine at this time. She plans to get it downstairs in the pharmacy.   Thyroid- Her last lab results were stable. She continues taking 25 mcg 1/2 tab synthroid daily PO.  Insomnia- She continues taking 0.5 tablet 50 mg trazodone daily PO to help fall asleep every night and reports doing well.  Health Maintenance Due  Topic Date Due   Hepatitis C Screening  Never done   COVID-19 Vaccine (4 - Booster for [employer] series) 08/15/2020    Past Medical History:  Diagnosis Date   Allergy    Anemia    Arthritis    Hyperlipidemia    Family Hx   Thyroid disease     Past Surgical History:  Procedure Laterality Date   APPENDECTOMY     BREAST EXCISIONAL BIOPSY Left    x 2   BREAST LUMPECTOMY WITH AXILLARY LYMPH NODE BIOPSY     HERNIA REPAIR     KNEE ARTHROSCOPY Left     Family History  Problem Relation Age of Onset   Heart disease Mother        MI at age 9, smoker   Hypertension Mother    Kidney disease Father    Kidney cancer Father    Heart disease Sister        heart valve replacement--2016   Diabetes type II Sister    Other Sister        antisynthetase Syndrome   Heart attack Sister     Social History   Socioeconomic History   Marital status: Married    Spouse name: Not on file   Number of children: Not on file   Years of education: Not on file   Highest education level: Not on file  Occupational History   Not on file  Tobacco Use   Smoking status: Never   Smokeless  tobacco: Never  Vaping Use   Vaping Use: Never used  Substance and Sexual Activity   Alcohol use: Yes    Alcohol/week: 1.0 - 2.0 standard drink    Types: 1 - 2 Glasses of wine per week   Drug use: Never   Sexual activity: Not on file  Other Topics Concern   Not on file  Social History Narrative   She is unemployed,  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     Enjoys traveling, reading cooking, spending time with family   Married    Social Determinants of Radio broadcast assistant Strain: Not on file  Food Insecurity: Not on file  Transportation Needs: Not on file  Physical Activity: Not on file  Stress: Not on file  Social Connections: Not on file  Intimate Partner Violence: Not on file    Outpatient Medications Prior to Visit  Medication Sig Dispense Refill   levothyroxine (SYNTHROID) 25 MCG tablet Take 0.5 tablets (12.5 mcg total) by  mouth daily before breakfast. 45 tablet 0   Biotin 1 MG CAPS Take by mouth.     cholecalciferol (VITAMIN D3) 25 MCG (1000 UT) tablet Take 1,000 Units by mouth daily.     COVID-19 mRNA vaccine, Pfizer, 30 MCG/0.3ML injection INJECT AS DIRECTED .3 mL 0   doxycycline (VIBRA-TABS) 100 MG tablet Take 1 tablet (100 mg total) by mouth 2 (two) times daily. 20 tablet 0   fexofenadine (ALLEGRA) 180 MG tablet 1 tablet     fluticasone (FLONASE) 50 MCG/ACT nasal spray Place 2 sprays into both nostrils daily. 16 g 6   influenza vaccine adjuvanted (FLUAD) 0.5 ML injection INJECT AS DIRECTED .5 mL 0   montelukast (SINGULAIR) 10 MG tablet Take 1 tablet (10 mg total) by mouth at bedtime. 30 tablet 3   traZODone (DESYREL) 50 MG tablet TAKE 1/2 TO 1 TABLET(25 TO 50 MG) BY MOUTH AT BEDTIME AS NEEDED FOR SLEEP 90 tablet 1   No facility-administered medications prior to visit.    Allergies  Allergen Reactions   Oxycodone Itching and Rash    Scratched herself until she bled    Penicillins Rash    Augmentin    ROS See HPI    Objective:    Physical Exam Constitutional:      General: She is not in acute distress.    Appearance: Normal appearance. She is not ill-appearing.  HENT:     Head: Normocephalic and atraumatic.     Right Ear: External ear normal.     Left Ear: External ear normal.  Eyes:     Extraocular Movements: Extraocular movements intact.     Pupils: Pupils are equal, round, and reactive to light.  Neck:     Thyroid: No thyroid mass or thyromegaly.  Cardiovascular:     Rate and Rhythm: Normal rate and regular rhythm.     Pulses: Normal pulses.     Heart sounds: Normal heart sounds. No murmur heard.   No gallop.  Pulmonary:     Effort: Pulmonary effort is normal. No respiratory distress.     Breath sounds: Normal breath sounds. No wheezing, rhonchi or rales.  Lymphadenopathy:     Cervical: No cervical adenopathy.  Skin:    General: Skin is warm and dry.  Neurological:     Mental Status: She is alert and oriented to person, place, and time.  Psychiatric:        Behavior: Behavior normal.    There were no vitals taken for this visit. Wt Readings from Last 3 Encounters:  10/09/20 145 lb (65.8 kg)  06/20/20 151 lb (68.5 kg)  05/20/19 147 lb (66.7 kg)       Assessment & Plan:   Problem List Items Addressed This Visit   None    No orders of the defined types were placed in this encounter.   I, Debbrah Alar NP, personally preformed the services described in this documentation.  All medical record entries made by the scribe were at my direction and in my presence.  I have reviewed the chart and discharge instructions (if applicable) and agree that the record reflects my personal performance and is accurate and complete. 12/21/2020   I,Shehryar Baig,acting as a Education administrator for [name], NP.,have documented all relevant documentation on the behalf of [name], NP,as directed by  [name], NP  while in the presence of [name], NP.   Shehryar [employer]

## 2020-12-21 NOTE — Assessment & Plan Note (Signed)
Stable, continue trazodone 50mg  PO HS.

## 2020-12-21 NOTE — Assessment & Plan Note (Signed)
Clinically stable on synthroid 12.58mcg once daily. Continue same.

## 2020-12-21 NOTE — Progress Notes (Signed)
   Covid-19 Vaccination Clinic  Name:  Kara Robinson    MRN: [id number] DOB: 06/24/1956  12/21/2020  [name] was observed post Covid-19 immunization for 15 minutes without incident. She was provided with Vaccine Information Sheet and instruction to access the V-Safe system.   [name] was instructed to call 911 with any severe reactions post vaccine: Difficulty breathing  Swelling of face and throat  A fast heartbeat  A bad rash all over body  Dizziness and weakness   Immunizations Administered     Name Date Dose VIS Date Route   PFIZER Comrnaty(Gray TOP) Covid-19 Vaccine 12/21/2020  9:11 AM 0.3 mL 06/14/2020 Intramuscular   Manufacturer: Rule   Lot: [id number]   [place]: [phone]

## 2020-12-21 NOTE — Assessment & Plan Note (Signed)
Notes significant improvement with addition of singulair 10mg  once daily. Will continue singulair, allegra 180mg  and flonase.

## 2020-12-21 NOTE — Assessment & Plan Note (Signed)
Repeat lipid panel.  Lab Results  Component Value Date   CHOL 225 (H) 06/20/2020   HDL 91.20 06/20/2020   LDLCALC 124 (H) 06/20/2020   TRIG 51.0 06/20/2020   CHOLHDL 2 06/20/2020

## 2020-12-21 NOTE — Patient Instructions (Signed)
Please complete lab work prior to leaving.   

## 2020-12-24 ENCOUNTER — Other Ambulatory Visit (HOSPITAL_BASED_OUTPATIENT_CLINIC_OR_DEPARTMENT_OTHER): Payer: Self-pay

## 2020-12-24 ENCOUNTER — Other Ambulatory Visit: Payer: Self-pay | Admitting: Family

## 2020-12-24 MED ORDER — LEVOTHYROXINE SODIUM 25 MCG PO TABS: 12.5000 ug | ORAL_TABLET | Freq: Every day | ORAL | 1 refills | Status: DC

## 2020-12-24 MED ORDER — COVID-19 MRNA VAC-TRIS(PFIZER) 30 MCG/0.3ML IM SUSP
INTRAMUSCULAR | 0 refills | Status: DC
  Filled 2020-12-24: qty 0.3, 1d supply, fill #0

## 2021-03-25 ENCOUNTER — Other Ambulatory Visit: Payer: Self-pay | Admitting: Family

## 2021-03-25 DIAGNOSIS — Z1231 Encounter for screening mammogram for malignant neoplasm of breast: Secondary | ICD-10-CM

## 2021-04-03 ENCOUNTER — Ambulatory Visit: Payer: PPO | Attending: Internal Medicine

## 2021-04-03 ENCOUNTER — Other Ambulatory Visit (HOSPITAL_BASED_OUTPATIENT_CLINIC_OR_DEPARTMENT_OTHER): Payer: Self-pay

## 2021-04-03 ENCOUNTER — Ambulatory Visit: Payer: PPO

## 2021-04-03 DIAGNOSIS — Z23 Encounter for immunization: Secondary | ICD-10-CM

## 2021-04-03 MED ORDER — INFLUENZA VAC A&B SA ADJ QUAD 0.5 ML IM PRSY
PREFILLED_SYRINGE | INTRAMUSCULAR | 0 refills | Status: DC
  Filled 2021-04-03: qty 0.5, 1d supply, fill #0

## 2021-04-03 NOTE — Progress Notes (Signed)
   Covid-19 Vaccination Clinic  Name:  Kara Robinson    MRN: [id number] DOB: [birth year]  04/03/2021  Kara Robinson was observed post Covid-19 immunization for 15 minutes without incident. She was provided with Vaccine Information Sheet and instruction to access the V-Safe system.   Kara Robinson was instructed to call 911 with any severe reactions post vaccine: Difficulty breathing  Swelling of face and throat  A fast heartbeat  A bad rash all over body  Dizziness and weakness

## 2021-04-15 ENCOUNTER — Other Ambulatory Visit (HOSPITAL_BASED_OUTPATIENT_CLINIC_OR_DEPARTMENT_OTHER): Payer: Self-pay

## 2021-04-15 MED ORDER — COVID-19MRNA BIVAL VACC PFIZER 30 MCG/0.3ML IM SUSP
INTRAMUSCULAR | 0 refills | Status: DC
  Filled 2021-04-15: qty 0.3, 1d supply, fill #0

## 2021-05-10 ENCOUNTER — Ambulatory Visit: Payer: PPO

## 2021-05-24 ENCOUNTER — Ambulatory Visit: Payer: PPO

## 2021-05-24 ENCOUNTER — Other Ambulatory Visit: Payer: Self-pay | Admitting: Family

## 2021-05-28 ENCOUNTER — Telehealth: Payer: Self-pay | Admitting: Family

## 2021-05-28 MED ORDER — MOLNUPIRAVIR 200 MG PO CAPS: 4.0000 | ORAL_CAPSULE | Freq: Two times a day (BID) | ORAL | 0 refills | Status: AC

## 2021-05-28 NOTE — Telephone Encounter (Signed)
Spoke to patient. Reviewed quarantine guidelines.

## 2021-05-28 NOTE — Telephone Encounter (Signed)
Pt tested positivie for covid on 11/22. Symptoms started on 11/21: cough, sore throat. Tried to schedule VV but we have nothing available. Pt is requesting antiviral. Please advise.

## 2021-06-20 ENCOUNTER — Other Ambulatory Visit: Payer: Self-pay | Admitting: Family

## 2021-06-21 ENCOUNTER — Ambulatory Visit
Admission: RE | Admit: 2021-06-21 | Discharge: 2021-06-21 | Disposition: A | Discharge status: Home / Self Care | Payer: PPO | Source: Ambulatory Visit | Attending: Family | Admitting: Family

## 2021-06-21 DIAGNOSIS — Z1231 Encounter for screening mammogram for malignant neoplasm of breast: Secondary | ICD-10-CM

## 2021-06-24 ENCOUNTER — Other Ambulatory Visit: Payer: Self-pay | Admitting: Family

## 2021-06-24 ENCOUNTER — Ambulatory Visit (INDEPENDENT_AMBULATORY_CARE_PROVIDER_SITE_OTHER): Payer: PPO | Admitting: Family

## 2021-06-24 ENCOUNTER — Encounter: Payer: Self-pay | Admitting: Family

## 2021-06-24 VITALS — BP 138/67 | HR 68 | Temp 97.8°F | Resp 16 | Ht 65.0 in | Wt 137.0 lb

## 2021-06-24 DIAGNOSIS — Z23 Encounter for immunization: Secondary | ICD-10-CM

## 2021-06-24 DIAGNOSIS — E039 Hypothyroidism, unspecified: Secondary | ICD-10-CM

## 2021-06-24 DIAGNOSIS — E785 Hyperlipidemia, unspecified: Secondary | ICD-10-CM | POA: Diagnosis not present

## 2021-06-24 DIAGNOSIS — R928 Other abnormal and inconclusive findings on diagnostic imaging of breast: Secondary | ICD-10-CM

## 2021-06-24 DIAGNOSIS — M8580 Other specified disorders of bone density and structure, unspecified site: Secondary | ICD-10-CM | POA: Diagnosis not present

## 2021-06-24 DIAGNOSIS — E348 Other specified endocrine disorders: Secondary | ICD-10-CM

## 2021-06-24 DIAGNOSIS — Z0000 Encounter for general adult medical examination without abnormal findings: Secondary | ICD-10-CM | POA: Insufficient documentation

## 2021-06-24 LAB — TSH: TSH: 1.2 u[IU]/mL (ref 0.35–5.50)

## 2021-06-24 LAB — COMPREHENSIVE METABOLIC PANEL
ALT: 16 U/L (ref 0–35)
AST: 18 U/L (ref 0–37)
Albumin: 4 g/dL (ref 3.5–5.2)
Alkaline Phosphatase: 56 U/L (ref 39–117)
BUN: 14 mg/dL (ref 6–23)
CO2: 26 mEq/L (ref 19–32)
Calcium: 9 mg/dL (ref 8.4–10.5)
Chloride: 105 mEq/L (ref 96–112)
Creatinine, Ser: 0.69 mg/dL (ref 0.40–1.20)
GFR: 90.21 mL/min (ref >60.00)
Glucose, Bld: 83 mg/dL (ref 70–99)
Potassium: 4.1 mEq/L (ref 3.5–5.1)
Sodium: 140 mEq/L (ref 135–145)
Total Bilirubin: 0.6 mg/dL (ref 0.2–1.2)
Total Protein: 5.9 g/dL — ABNORMAL LOW (ref 6.0–8.3)

## 2021-06-24 LAB — LIPID PANEL
Cholesterol: 202 mg/dL — ABNORMAL HIGH (ref 0–200)
HDL: 83.3 mg/dL (ref >39.00)
LDL Cholesterol: 102 mg/dL — ABNORMAL HIGH (ref 0–99)
NonHDL: 118.69
Total CHOL/HDL Ratio: 2
Triglycerides: 84 mg/dL (ref 0.0–149.0)
VLDL: 16.8 mg/dL (ref 0.0–40.0)

## 2021-06-24 NOTE — Progress Notes (Signed)
Subjective:     Patient ID: Kara Robinson, female    DOB: [birth year], 65 y.o.   MRN: [id number]  Chief Complaint  Patient presents with   Annual Exam    HPI  Patient presents today for complete physical.  Immunizations: due for prevnar 20 Diet: has been working on weight loss Wt Readings from Last 3 Encounters:  06/24/21 137 lb (62.1 kg)  12/21/20 137 lb (62.1 kg)  10/09/20 145 lb (65.8 kg)  Exercise: regular exercise Colonoscopy: 09/04/12, due 09/05/22 Dexa: Pap Smear: N/A Mammogram: 06/21/21- IMPRESSION: Further evaluation is suggested for possible asymmetry in the left breast.  Health Maintenance Due  Topic Date Due   Hepatitis C Screening  Never done   Pneumonia Vaccine 110+ Years old (2 - PCV) 06/20/2021    Past Medical History:  Diagnosis Date   Allergy    Anemia    Arthritis    Hyperlipidemia    Family Hx   Thyroid disease     Past Surgical History:  Procedure Laterality Date   APPENDECTOMY     BREAST EXCISIONAL BIOPSY Left    x 2   BREAST LUMPECTOMY WITH AXILLARY LYMPH NODE BIOPSY     HERNIA REPAIR     KNEE ARTHROSCOPY Left     Family History  Problem Relation Age of Onset   Heart disease Mother        MI at age 31, smoker   Hypertension Mother    Kidney disease Father    Kidney cancer Father    Heart disease Sister        heart valve replacement--2016   Diabetes type II Sister    Other Sister        antisynthetase Syndrome   Heart attack Sister     Social History   Socioeconomic History   Marital status: Married    Spouse name: Not on file   Number of children: Not on file   Years of education: Not on file   Highest education level: Not on file  Occupational History   Not on file  Tobacco Use   Smoking status: Never   Smokeless tobacco: Never  Vaping Use   Vaping Use: Never used  Substance and Sexual Activity   Alcohol use: Yes    Alcohol/week: 1.0 - 2.0 standard drink    Types: 1 - 2 Glasses of wine per week   Drug  use: Never   Sexual activity: Not on file  Other Topics Concern   Not on file  Social History Narrative   She is unemployed,  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     Enjoys traveling, reading cooking, spending time with family   Married    Social Determinants of Radio broadcast assistant Strain: Not on file  Food Insecurity: Not on file  Transportation Needs: Not on file  Physical Activity: Not on file  Stress: Not on file  Social Connections: Not on file  Intimate Partner Violence: Not on file    Outpatient Medications Prior to Visit  Medication Sig Dispense Refill   Biotin 1 MG CAPS Take by mouth.     cholecalciferol (VITAMIN D3) 25 MCG (1000 UT) tablet Take 1,000 Units by mouth daily.     fexofenadine (ALLEGRA) 180 MG tablet 1 tablet     fluticasone (FLONASE) 50 MCG/ACT nasal spray Place 2 sprays into  both nostrils daily. 16 g 6   influenza vaccine adjuvanted (FLUAD) 0.5 ML injection Inject into the muscle. 0.5 mL 0   levothyroxine (SYNTHROID) 25 MCG tablet Take 0.5 tablets (12.5 mcg total) by mouth daily before breakfast. 45 tablet 1   montelukast (SINGULAIR) 10 MG tablet TAKE 1 TABLET(10 MG) BY MOUTH AT BEDTIME 90 tablet 1   traZODone (DESYREL) 50 MG tablet TAKE 1/2 TO 1 TABLET(25 TO 50 MG) BY MOUTH AT BEDTIME AS NEEDED FOR SLEEP 90 tablet 1   COVID-19 mRNA bivalent vaccine, Pfizer, injection Inject into the muscle. 0.3 mL 0   COVID-19 mRNA Vac-TriS, Pfizer, SUSP injection Inject into the muscle. 0.3 mL 0   No facility-administered medications prior to visit.    Allergies  Allergen Reactions   Oxycodone Itching and Rash    Scratched herself until she bled   Penicillins Rash    Augmentin    Review of Systems  Constitutional:  Negative for fever.  HENT:  Negative for congestion.   Eyes:  Negative for blurred vision.  Respiratory:  Negative for cough.   Gastrointestinal:   Negative for constipation and diarrhea.  Genitourinary:  Negative for dysuria and frequency.  Musculoskeletal:  Negative for joint pain (bilateral hand pain/arthritis) and myalgias.  Skin:  Negative for rash.  Neurological:  Negative for headaches.  Psychiatric/Behavioral:         Denies depression/anxiety      Objective:    Physical Exam  BP 138/67 (BP Location: Right Arm, Patient Position: Sitting, Cuff Size: Small)    Pulse 68    Temp 97.8 F (36.6 C) (Oral)    Resp 16    Ht _0  (1.651 m)    Wt 137 lb (62.1 kg)    SpO2 100%    BMI 22.80 kg/m  Wt Readings from Last 3 Encounters:  06/24/21 137 lb (62.1 kg)  12/21/20 137 lb (62.1 kg)  10/09/20 145 lb (65.8 kg)   Physical Exam  Constitutional: She is oriented to person, place, and time. She appears well-developed and well-nourished. No distress.  HENT:  Head: Normocephalic and atraumatic.  Right Ear: Tympanic membrane and ear canal normal.  Left Ear: Tympanic membrane and ear canal normal.  Mouth/Throat: Not examined, pt wearing mask Eyes: Pupils are equal, round, and reactive to light. No scleral icterus.  Neck: Normal range of motion. No thyromegaly present.  Cardiovascular: Normal rate and regular rhythm.   No murmur heard. Pulmonary/Chest: Effort normal and breath sounds normal. No respiratory distress. He has no wheezes. She has no rales. She exhibits no tenderness.  Abdominal: Soft. Bowel sounds are normal. She exhibits no distension and no mass. There is no tenderness. There is no rebound and no guarding.  Musculoskeletal: She exhibits no edema.  Lymphadenopathy:    She has no cervical adenopathy.  Neurological: She is alert and oriented to person, place, and time. She exhibits normal muscle tone. Coordination normal.  Skin: Skin is warm and dry.  Psychiatric: She has a normal mood and affect. Her behavior is normal. Judgment and thought content normal.  Breast/pelvis: deferred           Assessment & Plan:        Assessment & Plan:   Problem List Items Addressed This Visit       Unprioritized   Preventative health care    Encouraged pt to keep up the good work with healthy diet and regular exercise.  She is advised to call if she has  not heard [facility] from [facility] about additional [facility] appointment.  Prevnar 20 today to complete pneumovax series.Other immunizations reviewed and up to date.       Hypothyroid   Relevant Orders   TSH   Hyperlipidemia   Relevant Orders   Comp Met (CMET)   Lipid panel   Other Visit Diagnoses     Osteopenia, unspecified location    -  Primary   Relevant Orders   DG Bone Density   Vitamin D 1,25 dihydroxy   Estradiol deficiency       Relevant Orders   DG Bone Density       I have discontinued Pamala Hurry A. Blevins's COVID-19 mRNA Vac-TriS Therapist, music) and COVID-19 mRNA bivalent vaccine Therapist, music). I am also having her maintain her fluticasone, fexofenadine, Biotin, cholecalciferol, levothyroxine, influenza vaccine adjuvanted, montelukast, and traZODone.  No orders of the defined types were placed in this encounter.

## 2021-06-24 NOTE — Patient Instructions (Signed)
Please complete lab work prior to leaving.  Keep up the good work with healthy diet and regular exercise.

## 2021-06-24 NOTE — Assessment & Plan Note (Signed)
Encouraged pt to keep up the good work with healthy diet and regular exercise.  She is advised to call if she has not heard back from the [facility] about additional imaging appointment.  Prevnar 20 today to complete pneumovax series.Other immunizations reviewed and up to date.

## 2021-06-29 LAB — VITAMIN D 1,25 DIHYDROXY
Vitamin D 1, 25 (OH)2 Total: 38 pg/mL (ref 18–72)
Vitamin D2 1, 25 (OH)2: <8 pg/mL
Vitamin D3 1, 25 (OH)2: 38 pg/mL

## 2021-07-02 ENCOUNTER — Telehealth: Payer: Self-pay | Admitting: Family

## 2021-07-02 ENCOUNTER — Ambulatory Visit (HOSPITAL_BASED_OUTPATIENT_CLINIC_OR_DEPARTMENT_OTHER)
Admission: RE | Admit: 2021-07-02 | Discharge: 2021-07-02 | Disposition: A | Discharge status: Home / Self Care | Payer: PPO | Source: Ambulatory Visit | Attending: Family | Admitting: Family

## 2021-07-02 ENCOUNTER — Other Ambulatory Visit: Payer: Self-pay

## 2021-07-02 DIAGNOSIS — M8580 Other specified disorders of bone density and structure, unspecified site: Secondary | ICD-10-CM | POA: Diagnosis present

## 2021-07-02 DIAGNOSIS — E348 Other specified endocrine disorders: Secondary | ICD-10-CM | POA: Insufficient documentation

## 2021-07-02 DIAGNOSIS — M810 Age-related osteoporosis without current pathological fracture: Secondary | ICD-10-CM | POA: Insufficient documentation

## 2021-07-02 DIAGNOSIS — Z13820 Encounter for screening for osteoporosis: Secondary | ICD-10-CM | POA: Diagnosis not present

## 2021-07-02 DIAGNOSIS — Z780 Asymptomatic menopausal state: Secondary | ICD-10-CM | POA: Insufficient documentation

## 2021-07-02 MED ORDER — ALENDRONATE SODIUM 70 MG PO TABS: 70.0000 mg | ORAL_TABLET | ORAL | 4 refills | Status: DC

## 2021-07-02 NOTE — Telephone Encounter (Signed)
See mychart.  

## 2021-07-25 ENCOUNTER — Ambulatory Visit: Payer: PPO

## 2021-07-25 ENCOUNTER — Ambulatory Visit
Admission: RE | Admit: 2021-07-25 | Discharge: 2021-07-25 | Disposition: A | Discharge status: Home / Self Care | Payer: PPO | Source: Ambulatory Visit | Attending: Family | Admitting: Family

## 2021-07-25 DIAGNOSIS — N6489 Other specified disorders of breast: Secondary | ICD-10-CM | POA: Diagnosis not present

## 2021-07-25 DIAGNOSIS — R928 Other abnormal and inconclusive findings on diagnostic imaging of breast: Secondary | ICD-10-CM

## 2021-07-25 DIAGNOSIS — R922 Inconclusive mammogram: Secondary | ICD-10-CM | POA: Diagnosis not present

## 2021-08-09 ENCOUNTER — Other Ambulatory Visit: Payer: Self-pay | Admitting: Family

## 2021-08-09 ENCOUNTER — Other Ambulatory Visit: Payer: Self-pay

## 2021-08-09 ENCOUNTER — Ambulatory Visit
Admission: RE | Admit: 2021-08-09 | Discharge: 2021-08-09 | Disposition: A | Discharge status: Home / Self Care | Payer: PPO | Source: Ambulatory Visit | Attending: Emergency Medicine | Admitting: Emergency Medicine

## 2021-08-09 VITALS — BP 138/79 | HR 72 | Temp 98.0°F | Resp 18

## 2021-08-09 DIAGNOSIS — J029 Acute pharyngitis, unspecified: Secondary | ICD-10-CM | POA: Diagnosis not present

## 2021-08-09 DIAGNOSIS — Z789 Other specified health status: Secondary | ICD-10-CM | POA: Insufficient documentation

## 2021-08-09 DIAGNOSIS — R519 Headache, unspecified: Secondary | ICD-10-CM | POA: Insufficient documentation

## 2021-08-09 LAB — POCT RAPID STREP A (OFFICE): Rapid Strep A Screen: NEGATIVE

## 2021-08-09 MED ORDER — CEFDINIR 300 MG PO CAPS
300.0000 mg | ORAL_CAPSULE | Freq: Two times a day (BID) | ORAL | 0 refills | Status: AC
Start: 2021-08-09 — End: 2021-08-19

## 2021-08-09 MED ORDER — FLUCONAZOLE 150 MG PO TABS: ORAL_TABLET | ORAL | 0 refills | Status: DC

## 2021-08-09 MED ORDER — LIDOCAINE VISCOUS HCL 2 % MT SOLN: 15.0000 mL | OROMUCOSAL | 0 refills | Status: DC | PRN

## 2021-08-09 NOTE — ED Triage Notes (Signed)
Pt reports having a sore throat that began on Monday.

## 2021-08-09 NOTE — ED Provider Notes (Signed)
[facility]    CSN: [id number] Arrival date & time: 08/09/21  9675    HISTORY   Chief Complaint  Patient presents with   Sore Throat   Appointment    0930   HPI Kara Robinson is a 66 y.o. female. Patient complains of a 5-day history of sore throat which patient states has gradually become worse.  Patient states she is having difficulty swallowing at this time.  Patient reports recent airplane travel, states no one else in her travel group has been sick.  Patient denies known sick contacts.  Patient states she has not had any nasal congestion, cough, upset stomach, rash, body ache.  Patient states 2 days ago she did notice that she was having some mild chills but is unaware of any fever.  Patient states she is also noticed that her right ear has been more muffled than her left for the past few days.  The history is provided by the patient.  Past Medical History:  Diagnosis Date   Allergy    Anemia    Arthritis    Hyperlipidemia    Family Hx   Thyroid disease    Patient Active Problem List   Diagnosis Date Noted   Preventative health care 06/24/2021   Insomnia 12/21/2020   Hypothyroid 04/07/2017   Hyperlipidemia 04/07/2017   Osteoarthritis 04/07/2017   Seasonal allergies 04/07/2017   Past Surgical History:  Procedure Laterality Date   APPENDECTOMY     BREAST EXCISIONAL BIOPSY Left    x 2   BREAST LUMPECTOMY WITH AXILLARY LYMPH NODE BIOPSY     HERNIA REPAIR     KNEE ARTHROSCOPY Left    OB History   No obstetric history on file.    Home Medications    Prior to Admission medications   Medication Sig Start Date End Date Taking? Authorizing Provider  alendronate (FOSAMAX) 70 MG tablet Take 1 tablet (70 mg total) by mouth every 7 (seven) days. Take with a full glass of water on an empty stomach. 07/02/21   Debbrah Alar, NP  Biotin 1 MG CAPS Take by mouth.    [provider]  cholecalciferol (VITAMIN D3) 25 MCG (1000 UT) tablet Take  1,000 Units by mouth daily.    [provider]  fexofenadine (ALLEGRA) 180 MG tablet 1 tablet    [provider]  fluticasone (FLONASE) 50 MCG/ACT nasal spray Place 2 sprays into both nostrils daily. 04/07/17   Debbrah Alar, NP  influenza vaccine adjuvanted (FLUAD) 0.5 ML injection Inject into the muscle. 04/03/21   Carlyle Basques, MD  levothyroxine (SYNTHROID) 25 MCG tablet Take 0.5 tablets (12.5 mcg total) by mouth daily before breakfast. 12/24/20   Debbrah Alar, NP  montelukast (SINGULAIR) 10 MG tablet TAKE 1 TABLET(10 MG) BY MOUTH AT BEDTIME 05/24/21   Debbrah Alar, NP  traZODone (DESYREL) 50 MG tablet TAKE 1/2 TO 1 TABLET(25 TO 50 MG) BY MOUTH AT BEDTIME AS NEEDED FOR SLEEP 06/21/21   Debbrah Alar, NP   Family History Family History  Problem Relation Age of Onset   Heart disease Mother        MI at age 21, smoker   Hypertension Mother    Kidney disease Father    Kidney cancer Father    Heart disease Sister        heart valve replacement--2016   Diabetes type II Sister    Other Sister        antisynthetase Syndrome   Heart attack Sister  Social History Social History   Tobacco Use   Smoking status: Never   Smokeless tobacco: Never  Vaping Use   Vaping Use: Never used  Substance Use Topics   Alcohol use: Yes    Alcohol/week: 1.0 - 2.0 standard drink    Types: 1 - 2 Glasses of wine per week   Drug use: Never   Allergies   Oxycodone and Penicillins  Review of Systems Review of Systems Pertinent findings noted in history of present illness.   Physical Exam Triage Vital Signs ED Triage Vitals  Enc Vitals Group     BP 05/03/21 0827 (!) 147/82     Pulse Rate 05/03/21 0827 72     Resp 05/03/21 0827 18     Temp 05/03/21 0827 98.3 F (36.8 C)     Temp Source 05/03/21 0827 Oral     SpO2 05/03/21 0827 98 %     Weight --      Height --      Head Circumference --      Peak Flow --      Pain Score 05/03/21 0826 5     Pain  Loc --      Pain Edu? --      Excl. in [place]? --   No data found.  Updated Vital Signs BP 138/79 (BP Location: Left Arm)    Pulse 72    Temp 98 F (36.7 C) (Oral)    Resp 18    SpO2 96%   Physical Exam Vitals and nursing note reviewed.  Constitutional:      General: She is not in acute distress.    Appearance: Normal appearance. She is well-developed, well-groomed and normal weight. She is not ill-appearing or toxic-appearing.  HENT:     Head: Normocephalic and atraumatic.     Salivary Glands: Right salivary gland is diffusely enlarged and tender. Left salivary gland is diffusely enlarged and tender.     Right Ear: Hearing and external ear normal.     Left Ear: Hearing and external ear normal.     Ears:     Comments: Bilateral EACs with mild erythema, bilateral TMs are normal    Nose: Mucosal edema (Mild) present. No congestion or rhinorrhea.     Right Turbinates: Not enlarged, swollen or pale.     Left Turbinates: Not enlarged or swollen.     Right Sinus: No maxillary sinus tenderness or frontal sinus tenderness.     Left Sinus: No maxillary sinus tenderness or frontal sinus tenderness.     Mouth/Throat:     Lips: Pink. No lesions.     Mouth: Mucous membranes are moist. No oral lesions or angioedema.     Dentition: No gingival swelling.     Tongue: No lesions.     Palate: No mass.     Pharynx: Uvula midline. Pharyngeal swelling and posterior oropharyngeal erythema present. No oropharyngeal exudate or uvula swelling.     Tonsils: Tonsillar exudate (Mild, right greater than left) present. No tonsillar abscesses. 2+ on the right. 2+ on the left.  Eyes:     General: Lids are normal. Vision grossly intact.     Extraocular Movements: Extraocular movements intact.     Conjunctiva/sclera: Conjunctivae normal.     Right eye: Right conjunctiva is not injected.     Left eye: Left conjunctiva is not injected.     Pupils: Pupils are equal, round, and reactive to light.  Neck:     Thyroid:  No thyroid  mass, thyromegaly or thyroid tenderness.     Trachea: Tracheal tenderness present. No abnormal tracheal secretions or tracheal deviation.     Comments: Voice is muffled Cardiovascular:     Rate and Rhythm: Normal rate and regular rhythm.     Pulses: Normal pulses.     Heart sounds: Normal heart sounds, S1 normal and S2 normal. No murmur heard.   No friction rub. No gallop.  Pulmonary:     Effort: Pulmonary effort is normal. No accessory muscle usage, prolonged expiration, respiratory distress or retractions.     Breath sounds: No stridor, decreased air movement or transmitted upper airway sounds. No decreased breath sounds, wheezing, rhonchi or rales.  Abdominal:     General: Bowel sounds are normal.     Palpations: Abdomen is soft.     Tenderness: There is generalized abdominal tenderness. There is no right CVA tenderness, left CVA tenderness or rebound. Negative signs include Murphy's sign.     Hernia: No hernia is present.  Musculoskeletal:        General: No tenderness. Normal range of motion.     Cervical back: Full passive range of motion without pain, normal range of motion and neck supple.     Right lower leg: No edema.     Left lower leg: No edema.  Lymphadenopathy:     Cervical: Cervical adenopathy present.     Right cervical: Superficial cervical adenopathy, deep cervical adenopathy and posterior cervical adenopathy present.     Left cervical: Superficial cervical adenopathy, deep cervical adenopathy and posterior cervical adenopathy present.  Skin:    General: Skin is warm and dry.     Findings: No erythema, lesion or rash.  Neurological:     General: No focal deficit present.     Mental Status: She is alert and oriented to person, place, and time. Mental status is at baseline.  Psychiatric:        Mood and Affect: Mood normal.        Behavior: Behavior normal. Behavior is cooperative.        Thought Content: Thought content normal.        Judgment: Judgment  normal.    Visual Acuity Right Eye Distance:   Left Eye Distance:   Bilateral Distance:    Right Eye Near:   Left Eye Near:    Bilateral Near:     [facility] Couse / Diagnostics / Procedures:    EKG  Radiology No results found.  Procedures Procedures (including critical care time)  UC Diagnoses / Final Clinical Impressions(s)   I have reviewed the triage vital signs and the nursing notes.  Pertinent labs & imaging results that were available during my care of the patient were reviewed by me and considered in my medical decision making (see chart for details).     Final diagnoses:  Acute pharyngitis, unspecified etiology  Recent travel on aircraft  Acute nonintractable headache, unspecified headache type   Rapid strep test today is negative, culture will be performed per protocol.  Patient was provided with a prescription for cefdinir given continued worsening of symptoms over the last 6 days, patient advised to continue taking cefdinir if she has significant improvement of her symptoms in 24 to 48 hours despite results of throat culture.  Patient advised that if there is no improvement with cefdinir and throat culture is negative, she can discontinue cefdinir.  Patient provided with a prescription for Diflucan given the inevitable vaginal yeast infection that occurs with antibiotic therapy.Marland Kitchen  COVID/flu testing was also performed, patient states she had COVID several months ago and she did recently travel by her cough.  Patient advised that if her COVID test is positive she will be provided with a prescription for Paxlovid.  Patient states last time she took Paxlovid it upset her stomach so she states she will think about taking it again, adds that she knows that it kept her out of the hospital. ED Prescriptions     Medication Sig Dispense Auth. Provider   cefdinir (OMNICEF) 300 MG capsule Take 1 capsule (300 mg total) by mouth 2 (two) times daily for 10 days. 20 capsule Lynden Oxford  Scales, PA-C   lidocaine (XYLOCAINE) 2 % solution Use as directed 15 mLs in the mouth or throat every 3 (three) hours as needed for mouth pain (Sore throat). 300 mL Lynden Oxford Scales, PA-C   fluconazole (DIFLUCAN) 150 MG tablet Take 1 tablet on day 4 of antibiotics.  Take second tablet 3 days later. 2 tablet Lynden Oxford Scales, PA-C      PDMP not reviewed this encounter.  Pending results:  Labs Reviewed  COVID-19, FLU A+B NAA  CULTURE, GROUP A STREP [facility])  POCT RAPID STREP A (OFFICE)    Medications Ordered in UC: Medications - No data to display  Disposition Upon Discharge:  Condition: stable for discharge home Home: take medications as prescribed; routine discharge instructions as discussed; follow up as advised.  Patient presented with an acute illness with associated systemic symptoms and significant discomfort requiring urgent management. In my opinion, this is a condition that a prudent lay person (someone who possesses an average knowledge of health and medicine) may potentially expect to result in complications if not addressed urgently such as respiratory distress, impairment of bodily function or dysfunction of bodily organs.   Routine symptom specific, illness specific and/or disease specific instructions were discussed with the patient and/or caregiver at length.   As such, the patient has been evaluated and assessed, work-up was performed and treatment was provided in alignment with urgent care protocols and evidence based medicine.  Patient/parent/caregiver has been advised that the patient may require follow up for further testing and treatment if the symptoms continue in spite of treatment, as clinically indicated and appropriate.  If the patient was tested for COVID-19, Influenza and/or RSV, then the patient/parent/guardian was advised to isolate at home pending the results of his/her diagnostic coronavirus test and potentially longer if theyre positive. I have  also advised pt that if his/her COVID-19 test returns positive, it's recommended to self-isolate for at least 10 days after symptoms first appeared AND until fever-free for 24 hours without fever reducer AND other symptoms have improved or resolved. Discussed self-isolation recommendations as well as instructions for household member/close contacts as per the [facility] and [place] [facility], and also gave patient the [place] packet with this information.  Patient/parent/caregiver has been advised to return to the [facility] or PCP in 3-5 days if no better; to PCP or the Emergency Department if new signs and symptoms develop, or if the current signs or symptoms continue to change or worsen for further workup, evaluation and treatment as clinically indicated and appropriate  The patient will follow up with their current PCP if and as advised. If the patient does not currently have a PCP we will assist them in obtaining one.   The patient may need specialty follow up if the symptoms continue, in spite of conservative treatment and management, for further workup, evaluation, consultation and  treatment as clinically indicated and appropriate.  Patient/parent/caregiver verbalized understanding and agreement of plan as discussed.  All questions were addressed during visit.  Please see discharge instructions below for further details of plan.  Discharge Instructions:   Discharge Instructions      Your strep test today is negative.  Throat culture will be performed per our protocol.  The result of your throat culture will be posted to your MyChart once it is complete, this typically takes 3 to 5 days.  If there is a positive result, you will be contacted by phone and antibiotics will be prescribed for you.   Based on my physical exam today and the history that you provided, I believe that you have bacterial pharyngitis.  Bacterial pharyngitis is most commonly caused by bacteria called group A Streptococcus but there are many other  culprits.   The rapid strep test that we performed in the office only catches 40% of known cases of group A streptococcal pharyngitis.  Additionally, and unfortunately, the throat culture that we perform here in the urgent care only evaluates for group A strep and not any of the other bacteria  that are known to cause bacterial pharyngitis.      You were also tested for both COVID and influenza today because here in the urgent care setting, we do not have an available option for an individual influenza test.  I apologize for the redundancy if you have already taken a COVID test at home.  The result of your viral testing will be posted to your MyChart once it is complete, this typically takes 24 to 48 hours.  If there is a positive result, you will be contacted by phone with further recommendations, if any.   If your COVID test is positive, you would benefit from Paxlovid, a prescription will be provided for you.  Clinical guidelines still recommend this medication even though a lot of patients report unwanted side effects, as you mentioned it does keep you out of the hospital.  Choice is yours whether you wish to take this medication or not.   Due to the duration of your symptoms, you would no longer benefit from antiviral therapy for influenza.  Conservative care is recommended at this time.  This includes rest, pushing clear fluids and activity as tolerated.  Warm beverages such as teas and broths versus cold beverages/popsicles and frozen sherbet/sorbet are your choice, both warm and cold are beneficial.  You may also notice that your appetite is reduced; this is okay as long as you are drinking plenty of clear fluids.   Please see the list below for recommended medications, dosages and frequencies to provide relief of your current symptoms:     Ibuprofen  (Advil, Motrin): This is a good anti-inflammatory medication which addresses aches, pains and inflammation of the upper airways that causes sinus and  nasal congestion as well as in the lower airways which makes your cough feel tight and sometimes burn.  I recommend that you take between 400 to 600 mg every 6-8 hours as needed, I have provided you with a prescription for 400 mg.      Lidocaine (Xylocaine): This is a numbing medication that can be swished for 15 seconds and swallowed.  You can use this every 3 hours while awake to relieve pain in your mouth and throat.  I have sent a prescription for this medication to your pharmacy.  Because we know that prolonged courses of antibiotic can cause vaginal yeast infections,  I have sent you an empiric dose of Diflucan to treat for presumed vaginal Candida should symptoms occur.  Please take as directed.   Please follow-up within the next 3 to 5 days either with your primary care provider or urgent care if your symptoms do not resolve.  If you do not have a primary care provider, we will assist you in finding one.  Thank you for visiting urgent care today.  I appreciate the opportunity to participate in your care.       This office note has been dictated using Museum/gallery curator.  Unfortunately, and despite my best efforts, this method of dictation can sometimes lead to occasional typographical or grammatical errors.  I apologize in advance if this occurs.     Lynden Oxford Scales, PA-C 08/09/21 1034

## 2021-08-09 NOTE — Discharge Instructions (Addendum)
Your strep test today is negative.  Throat culture will be performed per our protocol.  The result of your throat culture will be posted to your MyChart once it is complete, this typically takes 3 to 5 days.  If there is a positive result, you will be contacted by phone and antibiotics will be prescribed for you.   Based on my physical exam today and the history that you provided, I believe that you have bacterial pharyngitis.  Bacterial pharyngitis is most commonly caused by bacteria called group A Streptococcus but there are many other culprits.   The rapid strep test that we performed in the office only catches 40% of known cases of group A streptococcal pharyngitis.  Additionally, and unfortunately, the throat culture that we perform here in the urgent care only evaluates for group A strep and not any of the other bacteria  that are known to cause bacterial pharyngitis.      You were also tested for both COVID and influenza today because here in the urgent care setting, we do not have an available option for an individual influenza test.  I apologize for the redundancy if you have already taken a COVID test at home.  The result of your viral testing will be posted to your MyChart once it is complete, this typically takes 24 to 48 hours.  If there is a positive result, you will be contacted by phone with further recommendations, if any.   If your COVID test is positive, you would benefit from Paxlovid, a prescription will be provided for you.  Clinical guidelines still recommend this medication even though a lot of patients report unwanted side effects, as you mentioned it does keep you out of the hospital.  Choice is yours whether you wish to take this medication or not.   Due to the duration of your symptoms, you would no longer benefit from antiviral therapy for influenza.  Conservative care is recommended at this time.  This includes rest, pushing clear fluids and activity as tolerated.  Warm  beverages such as teas and broths versus cold beverages/popsicles and frozen sherbet/sorbet are your choice, both warm and cold are beneficial.  You may also notice that your appetite is reduced; this is okay as long as you are drinking plenty of clear fluids.   Please see the list below for recommended medications, dosages and frequencies to provide relief of your current symptoms:     Ibuprofen  (Advil, Motrin): This is a good anti-inflammatory medication which addresses aches, pains and inflammation of the upper airways that causes sinus and nasal congestion as well as in the lower airways which makes your cough feel tight and sometimes burn.  I recommend that you take between 400 to 600 mg every 6-8 hours as needed, I have provided you with a prescription for 400 mg.      Lidocaine (Xylocaine): This is a numbing medication that can be swished for 15 seconds and swallowed.  You can use this every 3 hours while awake to relieve pain in your mouth and throat.  I have sent a prescription for this medication to your pharmacy.  Because we know that prolonged courses of antibiotic can cause vaginal yeast infections, I have sent you an empiric dose of Diflucan to treat for presumed vaginal Candida should symptoms occur.  Please take as directed.   Please follow-up within the next 3 to 5 days either with your primary care provider or urgent care if your symptoms do  not resolve.  If you do not have a primary care provider, we will assist you in finding one.  Thank you for visiting urgent care today.  I appreciate the opportunity to participate in your care.

## 2021-08-10 LAB — COVID-19, FLU A+B NAA
Influenza A, NAA: NOT DETECTED
Influenza B, NAA: NOT DETECTED
SARS-CoV-2, NAA: NOT DETECTED

## 2021-08-12 LAB — CULTURE, GROUP A STREP (THRC)

## 2021-08-25 IMAGING — MG DIGITAL SCREENING BILAT W/ TOMO W/ CAD
8 series · 9 of 24 positions shown · non-contrast
Comparison: Previous exam(s).

CLINICAL DATA: Screening.

EXAM:
DIGITAL SCREENING BILATERAL MAMMOGRAM WITH TOMO AND CAD

[R CC synth-2D]
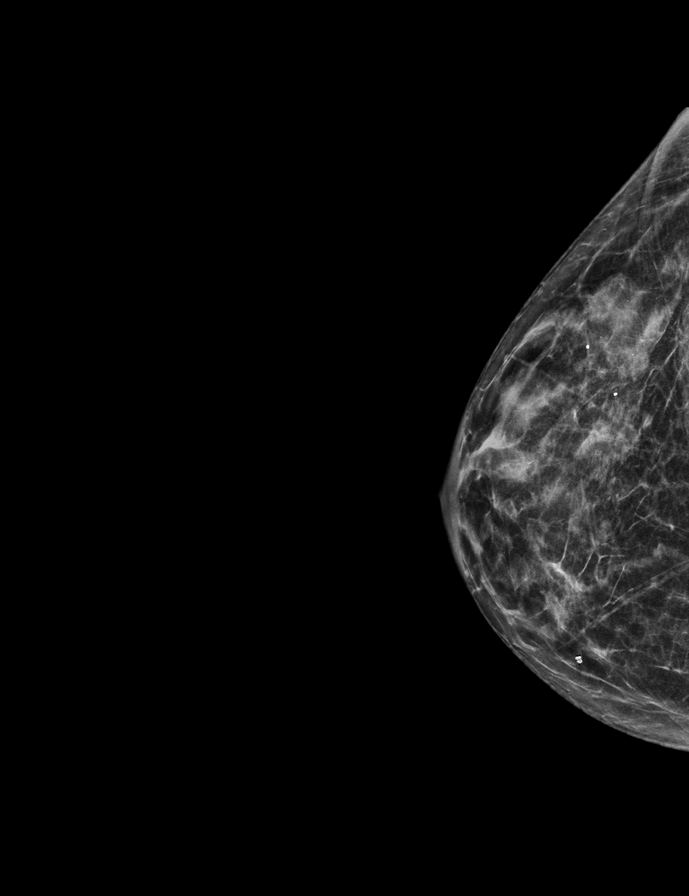

[L MLO synth-2D]
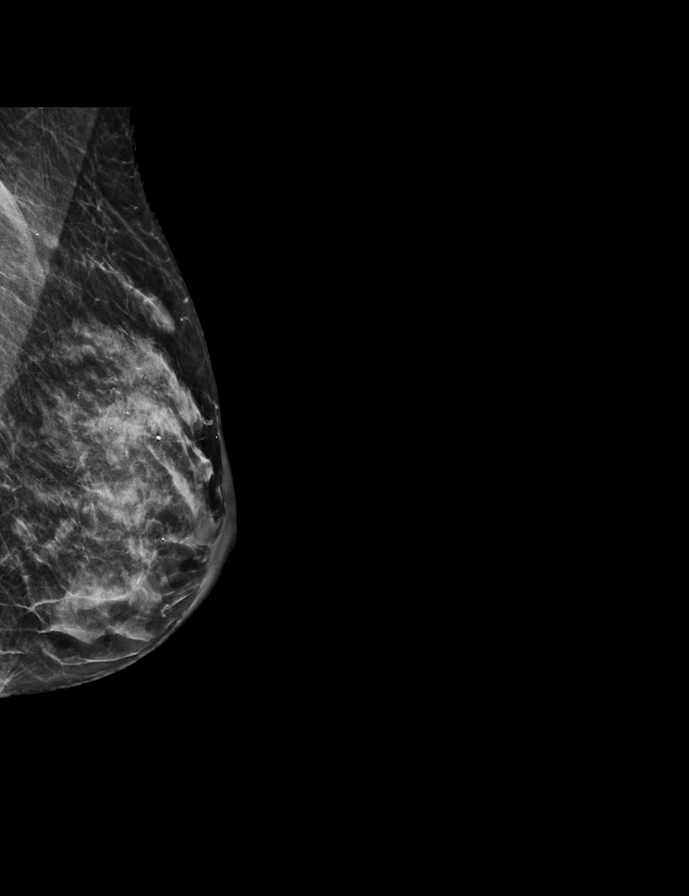

[L CC synth-2D]
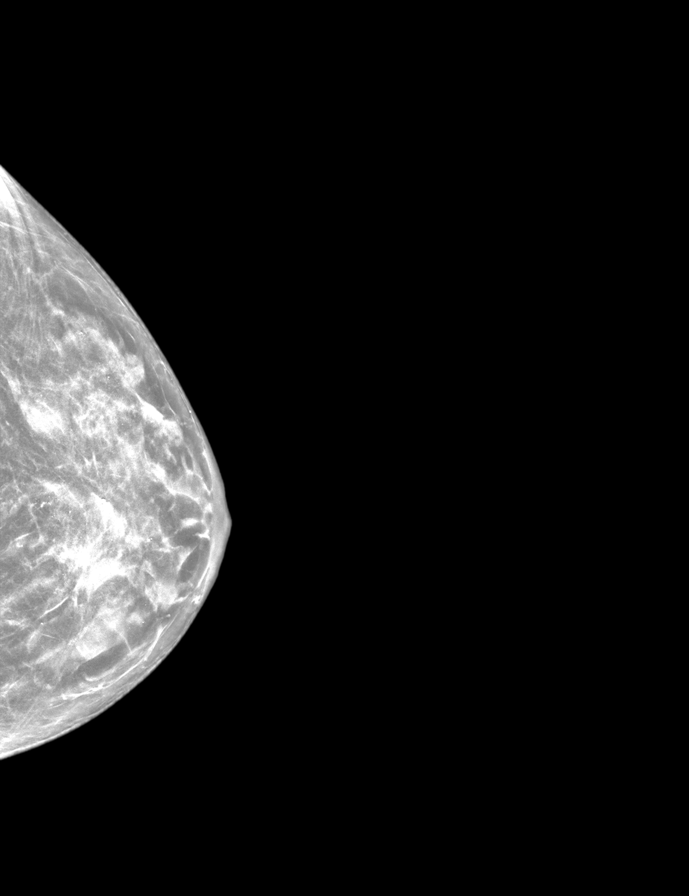

[R MLO synth-2D]
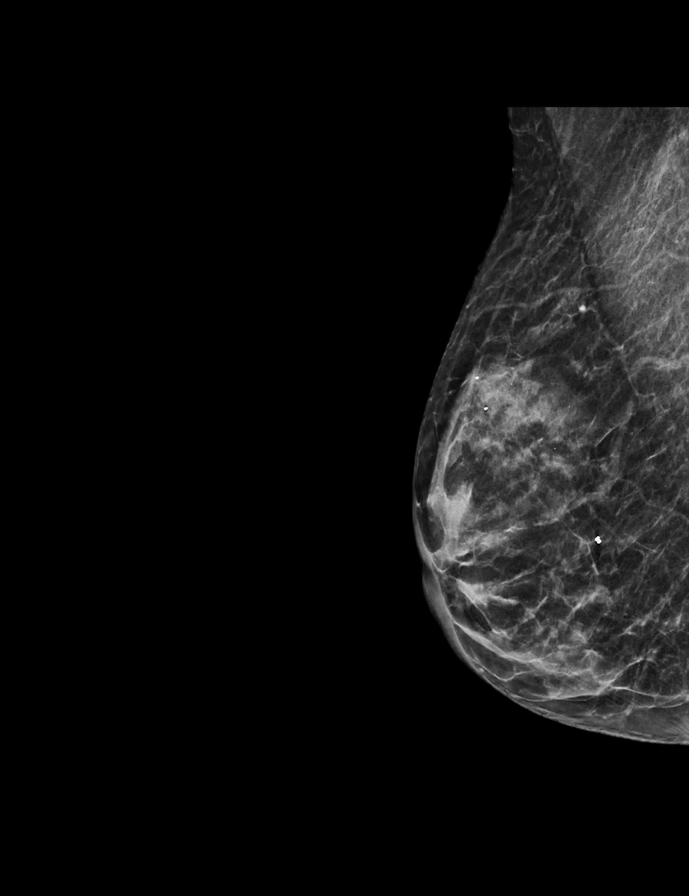

[L CC tomo · 2 of 50 frames shown]
[frame 17/50]
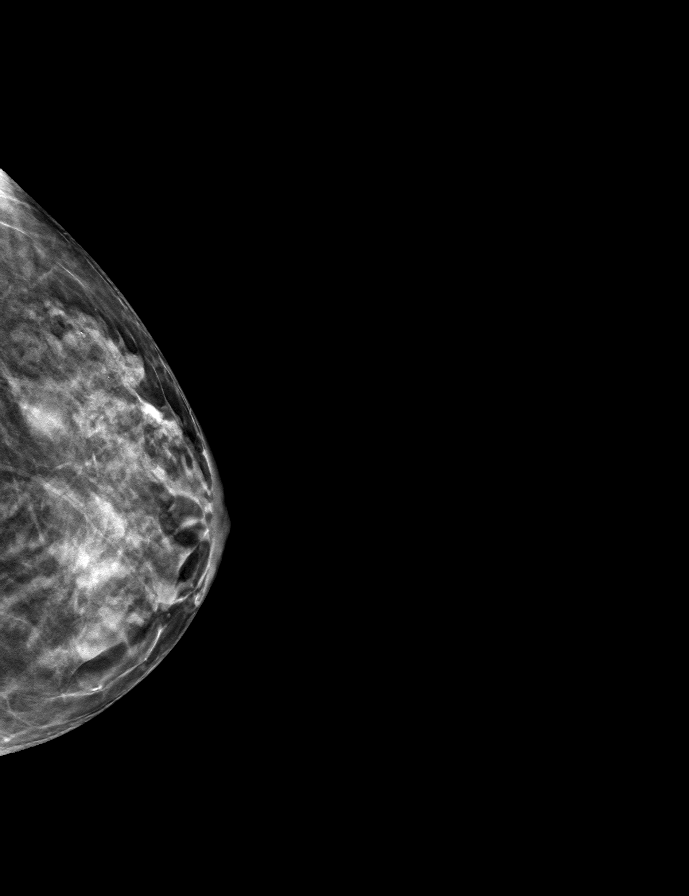
[frame 25/50]
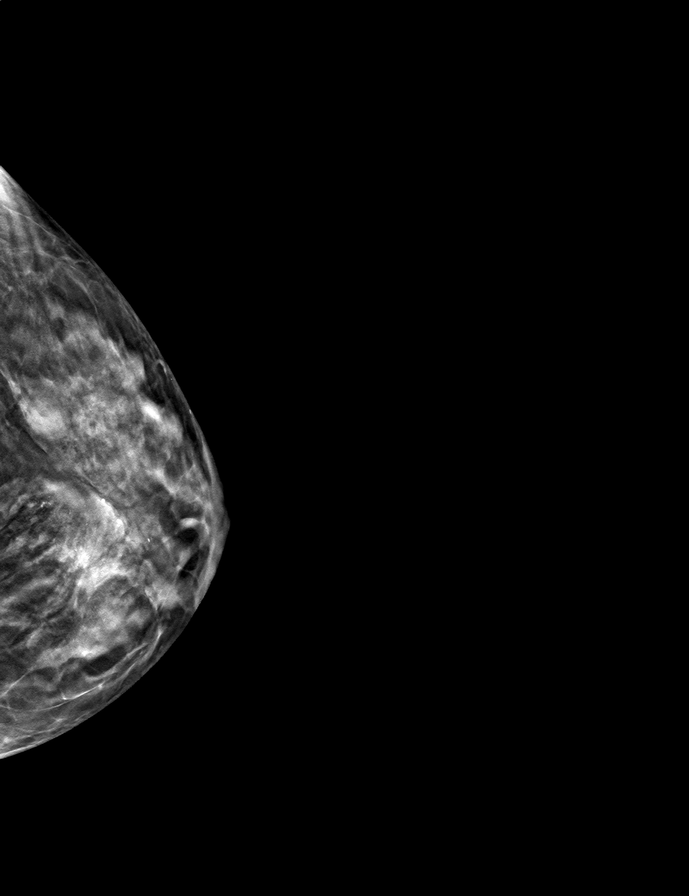

[R MLO tomo · tomo slice 23/45.0]
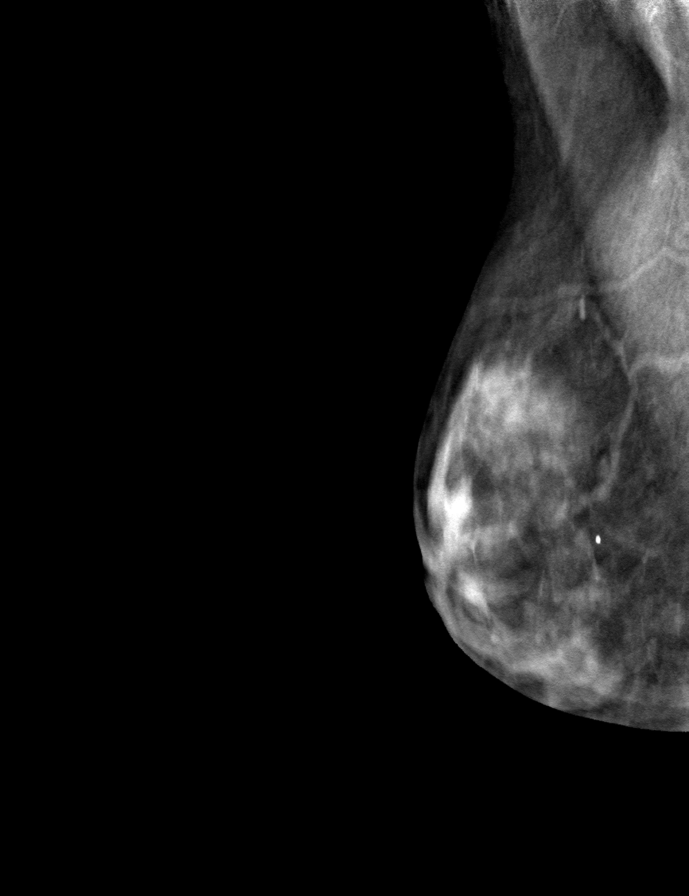

[R CC tomo · tomo slice 25/50.0]
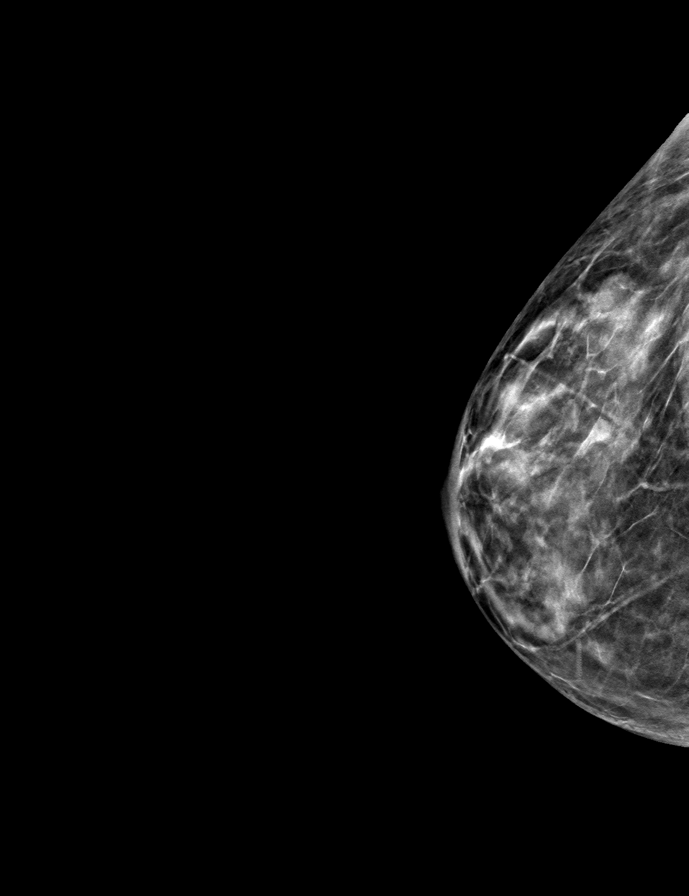

[L MLO tomo · tomo slice 25/49.0]
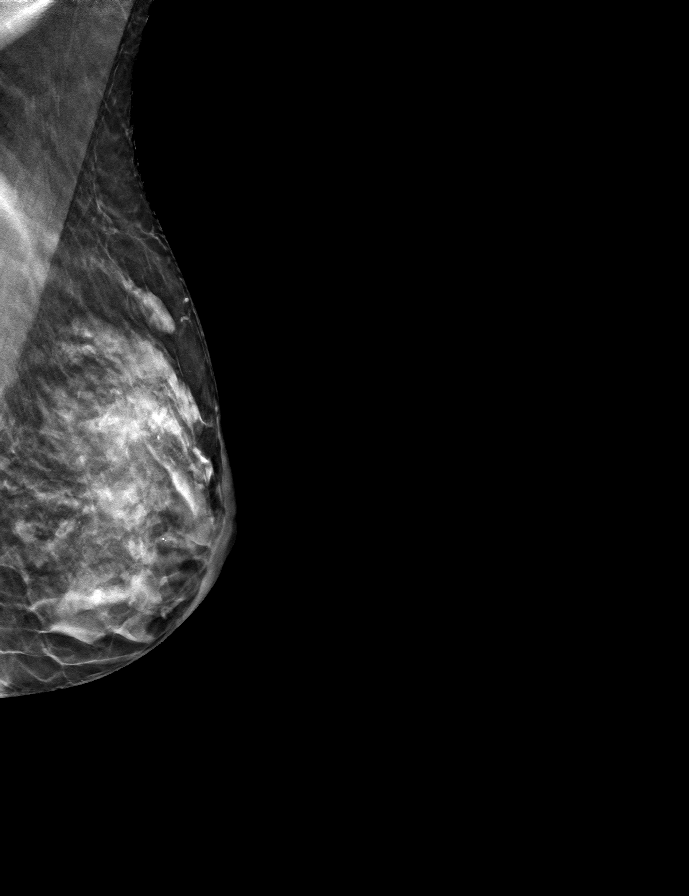

[9 of 24 positions shown; findings below may reference images not displayed]

ACR Breast Density Category c: The breast tissue is heterogeneously
dense, which may obscure small masses.
FINDINGS: There are no findings suspicious for malignancy. Images were
processed with CAD.
IMPRESSION: No mammographic evidence of malignancy. A result letter of this
screening mammogram will be mailed directly to the patient.

RECOMMENDATION:
Screening mammogram in one year. (Code:[id number])

BI-RADS CATEGORY  1: Negative.

## 2021-11-01 ENCOUNTER — Telehealth: Payer: Self-pay | Admitting: Family

## 2021-11-01 NOTE — Telephone Encounter (Signed)
Left message for patient to call back and schedule Medicare Annual Wellness Visit (AWV). Please offer to do virtually or by telephone.  Left office number and my jabber #[phone]. ? ?Due for AWVI ? ?Please schedule at anytime with Nurse Health Advisor. ?  ?

## 2021-11-26 ENCOUNTER — Other Ambulatory Visit: Payer: Self-pay | Admitting: Family

## 2021-11-29 DIAGNOSIS — Z23 Encounter for immunization: Secondary | ICD-10-CM | POA: Diagnosis not present

## 2021-12-09 ENCOUNTER — Other Ambulatory Visit: Payer: Self-pay | Admitting: Family

## 2021-12-23 ENCOUNTER — Ambulatory Visit (INDEPENDENT_AMBULATORY_CARE_PROVIDER_SITE_OTHER): Payer: PPO | Admitting: Family

## 2021-12-23 VITALS — BP 121/59 | HR 64 | Temp 97.9°F | Resp 16 | Ht 65.0 in | Wt 140.0 lb

## 2021-12-23 DIAGNOSIS — E039 Hypothyroidism, unspecified: Secondary | ICD-10-CM

## 2021-12-23 DIAGNOSIS — E785 Hyperlipidemia, unspecified: Secondary | ICD-10-CM

## 2021-12-23 DIAGNOSIS — J302 Other seasonal allergic rhinitis: Secondary | ICD-10-CM

## 2021-12-23 DIAGNOSIS — G4700 Insomnia, unspecified: Secondary | ICD-10-CM

## 2021-12-23 LAB — TSH: TSH: 0.91 u[IU]/mL (ref 0.35–5.50)

## 2021-12-23 NOTE — Progress Notes (Signed)
Subjective:     Patient ID: Kara Robinson, female    DOB: 01/22/1956, 66 y.o.   MRN: [id number]  Chief Complaint  Patient presents with   Hypothyroidism    Here for follow up    HPI Patient is in today for follow up.  Hypothyroid-  Lab Results  Component Value Date   TSH 1.20 06/24/2021   Maintained on synthroid 25 mcg 1/2 tab once daily. Feels well on this dose.   Allergic rhinitis- maintained on allegra and flonase and singulair. Notes singulair helped a lot through the spring.   Osteoporosis- last bone density 12/22. On fosamax.   Insomnia- Stable on one tab trazodone nightly.   Health Maintenance Due  Topic Date Due   Hepatitis C Screening  Never done    Past Medical History:  Diagnosis Date   Allergy    Anemia    Arthritis    Hyperlipidemia    Family Hx   Thyroid disease     Past Surgical History:  Procedure Laterality Date   APPENDECTOMY     BREAST EXCISIONAL BIOPSY Left    x 2   BREAST LUMPECTOMY WITH AXILLARY LYMPH NODE BIOPSY     HERNIA REPAIR     KNEE ARTHROSCOPY Left     Family History  Problem Relation Age of Onset   Heart disease Mother        MI at age 14, smoker   Hypertension Mother    Kidney disease Father    Kidney cancer Father    Heart disease Sister        heart valve replacement--2016   Diabetes type II Sister    Other Sister        antisynthetase Syndrome   Heart attack Sister     Social History   Socioeconomic History   Marital status: Married    Spouse name: Not on file   Number of children: Not on file   Years of education: Not on file   Highest education level: Not on file  Occupational History   Not on file  Tobacco Use   Smoking status: Never   Smokeless tobacco: Never  Vaping Use   Vaping Use: Never used  Substance and Sexual Activity   Alcohol use: Yes    Alcohol/week: 1.0 - 2.0 standard drink of alcohol    Types: 1 - 2 Glasses of wine per week   Drug use: Never   Sexual activity: Not on file   Other Topics Concern   Not on file  Social History Narrative   She is unemployed,  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     Enjoys traveling, reading cooking, spending time with family   Married    Social Determinants of Radio broadcast assistant Strain: Not on file  Food Insecurity: Not on file  Transportation Needs: Not on file  Physical Activity: Not on file  Stress: Not on file  Social Connections: Not on file  Intimate Partner Violence: Not on file    Outpatient Medications Prior to Visit  Medication Sig Dispense Refill   alendronate (FOSAMAX) 70 MG tablet Take 1 tablet (70 mg total) by mouth every 7 (seven) days. Take with a full glass of water on an empty stomach. 12 tablet 4   Biotin 1 MG CAPS Take by mouth.     cholecalciferol (VITAMIN D3) 25  MCG (1000 UT) tablet Take 1,000 Units by mouth daily.     fexofenadine (ALLEGRA) 180 MG tablet 1 tablet     fluticasone (FLONASE) 50 MCG/ACT nasal spray Place 2 sprays into both nostrils daily. 16 g 6   influenza vaccine adjuvanted (FLUAD) 0.5 ML injection Inject into the muscle. 0.5 mL 0   levothyroxine (SYNTHROID) 25 MCG tablet TAKE 1/2 TABLET(12.5 MCG) BY MOUTH DAILY BEFORE AND BREAKFAST 45 tablet 1   montelukast (SINGULAIR) 10 MG tablet TAKE 1 TABLET(10 MG) BY MOUTH AT BEDTIME 90 tablet 1   traZODone (DESYREL) 50 MG tablet TAKE 1/2 TO 1 TABLET(25 TO 50 MG) BY MOUTH AT BEDTIME AS NEEDED FOR SLEEP 90 tablet 1   lidocaine (XYLOCAINE) 2 % solution Use as directed 15 mLs in the mouth or throat every 3 (three) hours as needed for mouth pain (Sore throat). 300 mL 0   fluconazole (DIFLUCAN) 150 MG tablet Take 1 tablet on day 4 of antibiotics.  Take second tablet 3 days later. 2 tablet 0   No facility-administered medications prior to visit.    Allergies  Allergen Reactions   Oxycodone Itching and Rash    Scratched herself until she bled    Penicillins Rash    Augmentin    ROS    See HPI Objective:    Physical Exam Constitutional:      General: She is not in acute distress.    Appearance: Normal appearance. She is well-developed.  HENT:     Head: Normocephalic and atraumatic.     Right Ear: External ear normal.     Left Ear: External ear normal.  Eyes:     General: No scleral icterus. Neck:     Thyroid: No thyroid mass or thyromegaly.  Cardiovascular:     Rate and Rhythm: Normal rate and regular rhythm.     Heart sounds: Normal heart sounds. No murmur heard. Pulmonary:     Effort: Pulmonary effort is normal. No respiratory distress.     Breath sounds: Normal breath sounds. No wheezing.  Musculoskeletal:     Cervical back: Neck supple.  Skin:    General: Skin is warm and dry.  Neurological:     Mental Status: She is alert and oriented to person, place, and time.  Psychiatric:        Mood and Affect: Mood normal.        Behavior: Behavior normal.        Thought Content: Thought content normal.        Judgment: Judgment normal.     BP (!) 121/59 (BP Location: Right Arm, Patient Position: Sitting, Cuff Size: Small)   Pulse 64   Temp 97.9 F (36.6 C) (Oral)   Resp 16   Ht '5\' 5"'$  (1.651 m)   Wt 140 lb (63.5 kg)   SpO2 98%   BMI 23.30 kg/m  Wt Readings from Last 3 Encounters:  12/23/21 140 lb (63.5 kg)  06/24/21 137 lb (62.1 kg)  12/21/20 137 lb (62.1 kg)       Assessment & Plan:   Problem List Items Addressed This Visit       Unprioritized   Seasonal allergies    Improved since we added singulair. Continue singulair along with allegra/flonase.       Insomnia    Stable on HS trazodone. Continue same.       Hypothyroid - Primary    Clinically stable on synthroid 12.5 mcg once daily.  Continue same. Obtain follow up  tsh.       Relevant Orders   TSH   Hyperlipidemia    Lab Results  Component Value Date   CHOL 202 (H) 06/24/2021   HDL 83.30 06/24/2021   LDLCALC 102 (H) 06/24/2021    TRIG 84.0 06/24/2021   CHOLHDL 2 06/24/2021  Continues low fat/low cholesterol diet.       I have discontinued Kara Robinson's lidocaine and fluconazole. I am also having her maintain her fluticasone, fexofenadine, Biotin, cholecalciferol, influenza vaccine adjuvanted, alendronate, levothyroxine, montelukast, and traZODone.  No orders of the defined types were placed in this encounter.

## 2021-12-23 NOTE — Assessment & Plan Note (Signed)
Lab Results  Component Value Date   CHOL 202 (H) 06/24/2021   HDL 83.30 06/24/2021   LDLCALC 102 (H) 06/24/2021   TRIG 84.0 06/24/2021   CHOLHDL 2 06/24/2021   Continues low fat/low cholesterol diet.

## 2021-12-23 NOTE — Assessment & Plan Note (Signed)
Improved since we added singulair. Continue singulair along with allegra/flonase.

## 2021-12-23 NOTE — Assessment & Plan Note (Signed)
Clinically stable on synthroid 12.5 mcg once daily.  Continue same. Obtain follow up tsh.

## 2021-12-23 NOTE — Assessment & Plan Note (Signed)
Stable on HS trazodone. Continue same.

## 2021-12-24 ENCOUNTER — Other Ambulatory Visit: Payer: Self-pay | Admitting: Family

## 2021-12-24 MED ORDER — LEVOTHYROXINE SODIUM 25 MCG PO TABS: ORAL_TABLET | ORAL | 1 refills | Status: DC

## 2022-02-25 ENCOUNTER — Ambulatory Visit: Payer: PPO

## 2022-02-27 ENCOUNTER — Ambulatory Visit (INDEPENDENT_AMBULATORY_CARE_PROVIDER_SITE_OTHER): Payer: PPO

## 2022-02-27 ENCOUNTER — Ambulatory Visit: Payer: PPO

## 2022-02-27 DIAGNOSIS — Z0000 Encounter for general adult medical examination without abnormal findings: Secondary | ICD-10-CM | POA: Diagnosis not present

## 2022-02-27 NOTE — Patient Instructions (Signed)
Ms. Kara Robinson , Thank you for taking time to come for your Medicare Wellness Visit. I appreciate your ongoing commitment to your health goals. Please review the following plan we discussed and let me know if I can assist you in the future.   Screening recommendations/referrals: Colonoscopy: 09/04/12 due 09/05/22 Mammogram: 06/21/21 due 06/21/22 Bone Density: 07/02/21 due 07/03/23 Recommended yearly ophthalmology/optometry visit for glaucoma screening and checkup Recommended yearly dental visit for hygiene and checkup  Vaccinations: Influenza vaccine: up to date Pneumococcal vaccine: up to date Tdap vaccine: up to date Shingles vaccine: up to date   Covid-19:up to date  Advanced directives: yes, not file  Conditions/risks identified: see problem list   Next appointment: Follow up in one year for your annual wellness visit 02/2723   Preventive Care 67 Years and Older, Female Preventive care refers to lifestyle choices and visits with your health care provider that can promote health and wellness. What does preventive care include? A yearly physical exam. This is also called an annual well check. Dental exams once or twice a year. Routine eye exams. Ask your health care provider how often you should have your eyes checked. Personal lifestyle choices, including: Daily care of your teeth and gums. Regular physical activity. Eating a healthy diet. Avoiding tobacco and drug use. Limiting alcohol use. Practicing safe sex. Taking low-dose aspirin every day. Taking vitamin and mineral supplements as recommended by your health care provider. What happens during an annual well check? The services and screenings done by your health care provider during your annual well check will depend on your age, overall health, lifestyle risk factors, and family history of disease. Counseling  Your health care provider may ask you questions about your: Alcohol use. Tobacco use. Drug use. Emotional  well-being. Home and relationship well-being. Sexual activity. Eating habits. History of falls. Memory and ability to understand (cognition). Work and work Statistician. Reproductive health. Screening  You may have the following tests or measurements: Height, weight, and BMI. Blood pressure. Lipid and cholesterol levels. These may be checked every 5 years, or more frequently if you are over 53 years old. Skin check. Lung cancer screening. You may have this screening every year starting at age 77 if you have a 30-pack-year history of smoking and currently smoke or have quit within the past 15 years. Fecal occult blood test (FOBT) of the stool. You may have this test every year starting at age 37. Flexible sigmoidoscopy or colonoscopy. You may have a sigmoidoscopy every 5 years or a colonoscopy every 10 years starting at age 35. Hepatitis C blood test. Hepatitis B blood test. Sexually transmitted disease (STD) testing. Diabetes screening. This is done by checking your blood sugar (glucose) after you have not eaten for a while (fasting). You may have this done every 1-3 years. Bone density scan. This is done to screen for osteoporosis. You may have this done starting at age 18. Mammogram. This may be done every 1-2 years. Talk to your health care provider about how often you should have regular mammograms. Talk with your health care provider about your test results, treatment options, and if necessary, the need for more tests. Vaccines  Your health care provider may recommend certain vaccines, such as: Influenza vaccine. This is recommended every year. Tetanus, diphtheria, and acellular pertussis (Tdap, Td) vaccine. You may need a Td booster every 10 years. Zoster vaccine. You may need this after age 19. Pneumococcal 13-valent conjugate (PCV13) vaccine. One dose is recommended after age 60. Pneumococcal polysaccharide (  PPSV23) vaccine. One dose is recommended after age 104. Talk to your  health care provider about which screenings and vaccines you need and how often you need them. This information is not intended to replace advice given to you by your health care provider. Make sure you discuss any questions you have with your health care provider. Document Released: 07/20/2015 Document Revised: 03/12/2016 Document Reviewed: 04/24/2015 Elsevier Interactive Patient Education  2017 [place] Prevention in the Home Falls can cause injuries. They can happen to people of all ages. There are many things you can do to make your home safe and to help prevent falls. What can I do on the outside of my home? Regularly fix the edges of walkways and driveways and fix any cracks. Remove anything that might make you trip as you walk through a door, such as a raised step or threshold. Trim any bushes or trees on the path to your home. Use bright outdoor lighting. Clear any walking paths of anything that might make someone trip, such as rocks or tools. Regularly check to see if handrails are loose or broken. Make sure that both sides of any steps have handrails. Any raised decks and porches should have guardrails on the edges. Have any leaves, snow, or ice cleared regularly. Use sand or salt on walking paths during winter. Clean up any spills in your garage right away. This includes oil or grease spills. What can I do in the bathroom? Use night lights. Install grab bars by the toilet and in the tub and shower. Do not use towel bars as grab bars. Use non-skid mats or decals in the tub or shower. If you need to sit down in the shower, use a plastic, non-slip stool. Keep the floor dry. Clean up any water that spills on the floor as soon as it happens. Remove soap buildup in the tub or shower regularly. Attach bath mats securely with double-sided non-slip rug tape. Do not have throw rugs and other things on the floor that can make you trip. What can I do in the bedroom? Use night  lights. Make sure that you have a light by your bed that is easy to reach. Do not use any sheets or blankets that are too big for your bed. They should not hang down onto the floor. Have a firm chair that has side arms. You can use this for support while you get dressed. Do not have throw rugs and other things on the floor that can make you trip. What can I do in the kitchen? Clean up any spills right away. Avoid walking on wet floors. Keep items that you use a lot in easy-to-reach places. If you need to reach something above you, use a strong step stool that has a grab bar. Keep electrical cords out of the way. Do not use floor polish or wax that makes floors slippery. If you must use wax, use non-skid floor wax. Do not have throw rugs and other things on the floor that can make you trip. What can I do with my stairs? Do not leave any items on the stairs. Make sure that there are handrails on both sides of the stairs and use them. Fix handrails that are broken or loose. Make sure that handrails are as long as the stairways. Check any carpeting to make sure that it is firmly attached to the stairs. Fix any carpet that is loose or worn. Avoid having throw rugs at the top or  bottom of the stairs. If you do have throw rugs, attach them to the floor with carpet tape. Make sure that you have a light switch at the top of the stairs and the bottom of the stairs. If you do not have them, ask someone to add them for you. What else can I do to help prevent falls? Wear shoes that: Do not have high heels. Have rubber bottoms. Are comfortable and fit you well. Are closed at the toe. Do not wear sandals. If you use a stepladder: Make sure that it is fully opened. Do not climb a closed stepladder. Make sure that both sides of the stepladder are locked into place. Ask someone to hold it for you, if possible. Clearly mark and make sure that you can see: Any grab bars or handrails. First and last  steps. Where the edge of each step is. Use tools that help you move around (mobility aids) if they are needed. These include: Canes. Walkers. Scooters. Crutches. Turn on the lights when you go into a dark area. Replace any light bulbs as soon as they burn out. Set up your furniture so you have a clear path. Avoid moving your furniture around. If any of your floors are uneven, fix them. If there are any pets around you, be aware of where they are. Review your medicines with your doctor. Some medicines can make you feel dizzy. This can increase your chance of falling. Ask your doctor what other things that you can do to help prevent falls. This information is not intended to replace advice given to you by your health care provider. Make sure you discuss any questions you have with your health care provider. Document Released: 04/19/2009 Document Revised: 11/29/2015 Document Reviewed: 07/28/2014 Elsevier Interactive Patient Education  2017 [employer].

## 2022-02-27 NOTE — Progress Notes (Signed)
Subjective:   Kara Robinson is a 66 y.o. female who presents for an Initial Medicare Annual Wellness Visit.  I connected with  Kenneth Lax Artiaga on 02/27/22 by a audio enabled telemedicine application and verified that I am speaking with the correct person using two identifiers.  Patient Location: Home  Provider Location: Office/Clinic  I discussed the limitations of evaluation and management by telemedicine. The patient expressed understanding and agreed to proceed.   Review of Systems     Cardiac Risk Factors include: advanced age (>65mn, >>32women)     Objective:    There were no vitals filed for this visit. There is no height or weight on file to calculate BMI.     02/27/2022    1:43 PM 04/11/2018    8:58 PM  Advanced Directives  Does Patient Have a Medical Advance Directive? Yes No  Type of AParamedicof AGoldsbyLiving will   Does patient want to make changes to medical advance directive? No - Patient declined   Copy of HMidpinesin Chart? No - copy requested   Would patient like information on creating a medical advance directive?  No - Patient declined    Current Medications (verified) Outpatient Encounter Medications as of 02/27/2022  Medication Sig   alendronate (FOSAMAX) 70 MG tablet Take 1 tablet (70 mg total) by mouth every 7 (seven) days. Take with a full glass of water on an empty stomach.   Biotin 1 MG CAPS Take by mouth.   cholecalciferol (VITAMIN D3) 25 MCG (1000 UT) tablet Take 1,000 Units by mouth daily.   fexofenadine (ALLEGRA) 180 MG tablet 1 tablet   fluticasone (FLONASE) 50 MCG/ACT nasal spray Place 2 sprays into both nostrils daily.   influenza vaccine adjuvanted (FLUAD) 0.5 ML injection Inject into the muscle.   levothyroxine (SYNTHROID) 25 MCG tablet TAKE 1/2 TABLET(12.5 MCG) BY MOUTH DAILY BEFORE AND BREAKFAST   montelukast (SINGULAIR) 10 MG tablet TAKE 1 TABLET(10 MG) BY MOUTH AT BEDTIME    traZODone (DESYREL) 50 MG tablet TAKE 1/2 TO 1 TABLET(25 TO 50 MG) BY MOUTH AT BEDTIME AS NEEDED FOR SLEEP   No facility-administered encounter medications on file as of 02/27/2022.    Allergies (verified) Oxycodone and Penicillins   History: Past Medical History:  Diagnosis Date   Allergy    Anemia    Arthritis    Hyperlipidemia    Family Hx   Thyroid disease    Past Surgical History:  Procedure Laterality Date   APPENDECTOMY     BREAST EXCISIONAL BIOPSY Left    x 2   BREAST LUMPECTOMY WITH AXILLARY LYMPH NODE BIOPSY     HERNIA REPAIR     KNEE ARTHROSCOPY Left    Family History  Problem Relation Age of Onset   Heart disease Mother        MI at age 67 smoker   Hypertension Mother    Kidney disease Father    Kidney cancer Father    Heart disease Sister        heart valve replacement--2016   Diabetes type II Sister    Other Sister        antisynthetase Syndrome   Heart attack Sister    Social History   Socioeconomic History   Marital status: Married    Spouse name: Not on file   Number of children: Not on file   Years of education: Not on file   Highest education level: Not  on file  Occupational History   Not on file  Tobacco Use   Smoking status: Never   Smokeless tobacco: Never  Vaping Use   Vaping Use: Never used  Substance and Sexual Activity   Alcohol use: Yes    Alcohol/week: 1.0 - 2.0 standard drink of alcohol    Types: 1 - 2 Glasses of wine per week   Drug use: Never   Sexual activity: Not on file  Other Topics Concern   Not on file  Social History Narrative   She is unemployed,  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     Enjoys traveling, reading cooking, spending time with family   Married    Social Determinants of Radio broadcast assistant Strain: Not on file  Food Insecurity: Not on file  Transportation Needs: Not on file  Physical Activity:  Not on file  Stress: Not on file  Social Connections: Not on file    Tobacco Counseling Counseling given: Not Answered   Clinical Intake:  Pre-visit preparation completed: Yes  Pain : No/denies pain     Nutritional Risks: None Diabetes: No  How often do you need to have someone help you when you read instructions, pamphlets, or other written materials from your doctor or pharmacy?: 1 - Never  Diabetic?no  Interpreter Needed?: No  Information entered by :: [facility]    02/27/2022    1:45 PM  In your present state of health, do you have any difficulty performing the following activities:  Hearing? 0  Vision? 0  Difficulty concentrating or making decisions? 0  Walking or climbing stairs? 0  Dressing or bathing? 0  Doing errands, shopping? 0  Preparing Food and eating ? N  Using the Toilet? N  In the past six months, have you accidently leaked urine? N  Do you have problems with loss of bowel control? N  Managing your Medications? N  Managing your Finances? N  Housekeeping or managing your Housekeeping? N    Patient Care Team: Debbrah Alar, NP as PCP - General (Internal Medicine)  Indicate any recent Medical Services you may have received from other than [facility] providers in the past year (date may be approximate).     Assessment:   This is a routine wellness examination for [place].  Hearing/Vision screen No results found.  Dietary issues and exercise activities discussed: Current Exercise Habits: Home exercise routine, Type of exercise: walking;strength training/weights, Time (Minutes): 60, Frequency (Times/Week): 7, Weekly Exercise (Minutes/Week): 420, Intensity: Mild, Exercise limited by: None identified   Goals Addressed   None    Depression Screen    02/27/2022    1:44 PM 12/23/2021    7:04 AM 12/21/2020    7:10 AM 10/09/2020    3:23 PM 05/20/2019    9:09 AM 05/18/2018    3:54 PM 04/28/2017    4:33 PM  PHQ  2/9 Scores  PHQ - 2 Score 0 0 0 0 0 0 0  PHQ- 9 Score     0 1 1    Fall Risk    02/27/2022    1:44 PM 12/23/2021    7:04 AM 12/21/2020    7:10 AM  [place] in the past year? 0 0 0  Number falls in past yr: 0 0 0  Injury with Fall? 0 0  0  Risk for fall due to : No Fall Risks    Follow up Falls evaluation completed      Warden:  Any stairs in or around the home? Yes  If so, are there any without handrails? No  Home free of loose throw rugs in walkways, pet beds, electrical cords, etc? Yes  Adequate lighting in your home to reduce risk of falls? Yes   ASSISTIVE DEVICES UTILIZED TO PREVENT FALLS:  Life alert? No  Use of a cane, walker or w/c? No  Grab bars in the bathroom? No  Shower chair or bench in shower? Yes  Elevated toilet seat or a handicapped toilet? Yes   TIMED UP AND GO:  Was the test performed? No .    Cognitive Function:        02/27/2022    1:49 PM  6CIT Screen  What Year? 0 points  What month? 0 points  What time? 0 points  Count back from 20 0 points  Months in reverse 0 points  Repeat phrase 0 points  Total Score 0 points    Immunizations Immunization History  Administered Date(s) Administered   Fluad Quad(high Dose 65+) 04/03/2021   Influenza,inj,Quad PF,6+ Mos 04/07/2017, 03/16/2018, 04/07/2019   Influenza-Unspecified 04/24/2015, 05/06/2016, 05/15/2020   PFIZER Comirnaty(Gray Top)Covid-19 Tri-Sucrose Vaccine 12/21/2020   PFIZER(Purple Top)SARS-COV-2 Vaccination 09/20/2019, 10/11/2019, 05/15/2020   PNEUMOCOCCAL CONJUGATE-20 06/24/2021   Pfizer Covid-19 Vaccine Bivalent Booster 82yr & up 04/03/2021, 11/29/2021   Pneumococcal Polysaccharide-23 06/20/2020   Tdap 04/28/2017   Zoster Recombinat (Shingrix) 03/16/2018, 05/18/2018   Zoster, Live 03/15/2015    TDAP status: Up to date  Flu Vaccine status: Up to date  Pneumococcal vaccine status: Up to date  Covid-19 vaccine status: Completed  vaccines  Qualifies for Shingles Vaccine? Yes   Zostavax completed No   Shingrix Completed?: Yes  Screening Tests Health Maintenance  Topic Date Due   Hepatitis C Screening  Never done   COVID-19 Vaccine (7 - Pfizer risk series) 01/24/2022   INFLUENZA VACCINE  02/04/2022   COLONOSCOPY (Pts 45-46yrInsurance coverage will need to be confirmed)  09/05/2022   MAMMOGRAM  06/22/2023   TETANUS/TDAP  04/29/2027   Pneumonia Vaccine 6521Years old  Completed   DEXA SCAN  Completed   Zoster Vaccines- Shingrix  Completed   HPV VACCINES  Aged Out    Health Maintenance  Health Maintenance Due  Topic Date Due   Hepatitis C Screening  Never done   COVID-19 Vaccine (7 - PfPurvisisk series) 01/24/2022   INFLUENZA VACCINE  02/04/2022    Colorectal cancer screening: Type of screening: Colonoscopy. Completed 09/04/12. Repeat every 10 years  Mammogram status: Completed 06/21/21. Repeat every year  Bone Density status: Completed 07/02/21. Results reflect: Bone density results: OSTEOPOROSIS. Repeat every 2 years.  Lung Cancer Screening: (Low Dose CT Chest recommended if Age 67-80ears, 30 pack-year currently smoking OR have quit w/in 15years.) does not qualify.   Lung Cancer Screening Referral: n/a  Additional Screening:  Hepatitis C Screening: does qualify; Completed not yet  Vision Screening: Recommended annual ophthalmology exams for early detection of glaucoma and other disorders of the eye. Is the patient up to date with their annual eye exam?  Yes  Who is the provider or what is the name of the office in which the patient attends annual eye exams? [facility] If pt is not established with a provider, would they like to be referred to a provider  to establish care? No .   Dental Screening: Recommended annual dental exams for proper oral hygiene  Community Resource Referral / Chronic Care Management: CRR required this visit?  No   CCM required this visit?  No      Plan:     I have  personally reviewed and noted the following in the patient's chart:   Medical and social history Use of alcohol, tobacco or illicit drugs  Current medications and supplements including opioid prescriptions. Patient is not currently taking opioid prescriptions. Functional ability and status Nutritional status Physical activity Advanced directives List of other physicians Hospitalizations, surgeries, and ER visits in previous 12 months Vitals Screenings to include cognitive, depression, and falls Referrals and appointments  In addition, I have reviewed and discussed with patient certain preventive protocols, quality metrics, and best practice recommendations. A written personalized care plan for preventive services as well as general preventive health recommendations were provided to patient.  Due to this being a telephonic visit, the after visit summary with patients personalized plan was offered to patient via mail or my-chart. Patient would like to access on my-chart.      Duard Brady Thang [place], [place]   02/27/2022   Nurse Notes: none

## 2022-02-27 NOTE — Progress Notes (Deleted)
Annual Wellness Visit     Patient: Kara Robinson, Female    DOB: 08-06-1955, 66 y.o.   MRN: [id number]  Subjective  No chief complaint on file.   Kara Robinson is a 66 y.o. female who presents today for her Annual Wellness Visit. She reports consuming a {diet types:17450} diet. {Exercise:19826} She generally feels {well/fairly well/poorly:18703}. She reports sleeping {well/fairly well/poorly:18703}. She {does/does not:200015} have additional problems to discuss today.   HPI  {VISON DENTAL STD PSA (Optional):27386}   {History (Optional):23778}  Medications: Outpatient Medications Prior to Visit  Medication Sig   alendronate (FOSAMAX) 70 MG tablet Take 1 tablet (70 mg total) by mouth every 7 (seven) days. Take with a full glass of water on an empty stomach.   Biotin 1 MG CAPS Take by mouth.   cholecalciferol (VITAMIN D3) 25 MCG (1000 UT) tablet Take 1,000 Units by mouth daily.   fexofenadine (ALLEGRA) 180 MG tablet 1 tablet   fluticasone (FLONASE) 50 MCG/ACT nasal spray Place 2 sprays into both nostrils daily.   influenza vaccine adjuvanted (FLUAD) 0.5 ML injection Inject into the muscle.   levothyroxine (SYNTHROID) 25 MCG tablet TAKE 1/2 TABLET(12.5 MCG) BY MOUTH DAILY BEFORE AND BREAKFAST   montelukast (SINGULAIR) 10 MG tablet TAKE 1 TABLET(10 MG) BY MOUTH AT BEDTIME   traZODone (DESYREL) 50 MG tablet TAKE 1/2 TO 1 TABLET(25 TO 50 MG) BY MOUTH AT BEDTIME AS NEEDED FOR SLEEP   No facility-administered medications prior to visit.    Allergies  Allergen Reactions   Oxycodone Itching and Rash    Scratched herself until she bled   Penicillins Rash    Augmentin    Patient Care Team: Debbrah Alar, NP as PCP - General (Internal Medicine)  ROS      Objective  There were no vitals taken for this visit. {Vitals History (Optional):23777}  Physical Exam    Most recent functional status assessment:     No data to display         Most recent fall  risk assessment:    12/23/2021    7:04 AM  Page in the past year? 0  Number falls in past yr: 0  Injury with Fall? 0    Most recent depression screenings:    12/23/2021    7:04 AM 12/21/2020    7:10 AM  PHQ 2/9 Scores  PHQ - 2 Score 0 0   Most recent cognitive screening:     No data to display         Most recent Audit-C alcohol use screening     No data to display         A score of 3 or more in women, and 4 or more in men indicates increased risk for alcohol abuse, EXCEPT if all of the points are from question 1   Vision/Hearing Screen: No results found.  {Labs (Optional):23779}  No results found for any visits on 02/27/22.    Assessment & Plan   Annual wellness visit done today including the all of the following: Reviewed patient's Family Medical History Reviewed and updated list of patient's medical providers Assessment of cognitive impairment was done Assessed patient's functional ability Established a written schedule for health screening [place] Completed and Reviewed  Exercise Activities and Dietary recommendations  Goals   None     Immunization History  Administered Date(s) Administered   Fluad Quad(high Dose 65+) 04/03/2021   Influenza,inj,Quad PF,6+ Mos 04/07/2017,  03/16/2018, 04/07/2019   Influenza-Unspecified 04/24/2015, 05/06/2016, 05/15/2020   PFIZER Comirnaty(Gray Top)Covid-19 Tri-Sucrose Vaccine 12/21/2020   PFIZER(Purple Top)SARS-COV-2 Vaccination 09/20/2019, 10/11/2019, 05/15/2020   PNEUMOCOCCAL CONJUGATE-20 06/24/2021   Pfizer Covid-19 Vaccine Bivalent Booster 49yr & up 04/03/2021, 11/29/2021   Pneumococcal Polysaccharide-23 06/20/2020   Tdap 04/28/2017   Zoster Recombinat (Shingrix) 03/16/2018, 05/18/2018   Zoster, Live 03/15/2015    Health Maintenance  Topic Date Due   Hepatitis C Screening  Never done   COVID-19 Vaccine (7 - Pfizer risk series) 01/24/2022   INFLUENZA VACCINE   02/04/2022   COLONOSCOPY (Pts 45-462yrInsurance coverage will need to be confirmed)  09/05/2022   MAMMOGRAM  06/22/2023   TETANUS/TDAP  04/29/2027   Pneumonia Vaccine 6571Years old  Completed   DEXA SCAN  Completed   Zoster Vaccines- Shingrix  Completed   HPV VACCINES  Aged Out     Discussed health benefits of physical activity, and encouraged her to engage in regular exercise appropriate for her age and condition.    Problem List Items Addressed This Visit   None   No follow-ups on file.     BeBeatris ShipCM[place]

## 2022-05-08 ENCOUNTER — Other Ambulatory Visit: Payer: Self-pay | Admitting: Family

## 2022-05-08 DIAGNOSIS — Z1231 Encounter for screening mammogram for malignant neoplasm of breast: Secondary | ICD-10-CM

## 2022-05-29 ENCOUNTER — Other Ambulatory Visit: Payer: Self-pay | Admitting: Family

## 2022-06-10 ENCOUNTER — Other Ambulatory Visit: Payer: Self-pay

## 2022-06-10 MED ORDER — TRAZODONE HCL 50 MG PO TABS: ORAL_TABLET | ORAL | 1 refills | Status: DC

## 2022-06-27 ENCOUNTER — Encounter: Payer: Self-pay | Admitting: Family

## 2022-06-27 ENCOUNTER — Ambulatory Visit (INDEPENDENT_AMBULATORY_CARE_PROVIDER_SITE_OTHER): Payer: PPO | Admitting: Family

## 2022-06-27 VITALS — BP 140/50 | HR 65 | Temp 97.5°F | Resp 18 | Ht 65.0 in | Wt 143.6 lb

## 2022-06-27 DIAGNOSIS — E785 Hyperlipidemia, unspecified: Secondary | ICD-10-CM | POA: Diagnosis not present

## 2022-06-27 DIAGNOSIS — E039 Hypothyroidism, unspecified: Secondary | ICD-10-CM | POA: Diagnosis not present

## 2022-06-27 DIAGNOSIS — Z0000 Encounter for general adult medical examination without abnormal findings: Secondary | ICD-10-CM | POA: Diagnosis not present

## 2022-06-27 DIAGNOSIS — J302 Other seasonal allergic rhinitis: Secondary | ICD-10-CM

## 2022-06-27 DIAGNOSIS — Z1159 Encounter for screening for other viral diseases: Secondary | ICD-10-CM | POA: Diagnosis not present

## 2022-06-27 LAB — LIPID PANEL
Cholesterol: 232 mg/dL — ABNORMAL HIGH (ref 0–200)
HDL: 86.8 mg/dL (ref >39.00)
LDL Cholesterol: 128 mg/dL — ABNORMAL HIGH (ref 0–99)
NonHDL: 144.75
Total CHOL/HDL Ratio: 3
Triglycerides: 86 mg/dL (ref 0.0–149.0)
VLDL: 17.2 mg/dL (ref 0.0–40.0)

## 2022-06-27 LAB — COMPREHENSIVE METABOLIC PANEL
ALT: 10 U/L (ref 0–35)
AST: 13 U/L (ref 0–37)
Albumin: 4.3 g/dL (ref 3.5–5.2)
Alkaline Phosphatase: 53 U/L (ref 39–117)
BUN: 13 mg/dL (ref 6–23)
CO2: 29 mEq/L (ref 19–32)
Calcium: 9.1 mg/dL (ref 8.4–10.5)
Chloride: 104 mEq/L (ref 96–112)
Creatinine, Ser: 0.68 mg/dL (ref 0.40–1.20)
GFR: 89.89 mL/min (ref >60.00)
Glucose, Bld: 83 mg/dL (ref 70–99)
Potassium: 4.6 mEq/L (ref 3.5–5.1)
Sodium: 141 mEq/L (ref 135–145)
Total Bilirubin: 0.5 mg/dL (ref 0.2–1.2)
Total Protein: 6.5 g/dL (ref 6.0–8.3)

## 2022-06-27 LAB — HEPATITIS C ANTIBODY: Hepatitis C Ab: NONREACTIVE

## 2022-06-27 NOTE — Assessment & Plan Note (Addendum)
Continue healthy diet and regular exercise. Mammo scheduled. Colo will be due next year. Immunizations reviewed and up to date.

## 2022-06-27 NOTE — Progress Notes (Addendum)
Subjective:   By signing my name below, I, Carylon Perches, attest that this documentation has been prepared under the direction and in the presence of Wilson, NP 06/27/2022   Patient ID: Kara Robinson, female    DOB: December 29, 1955, 66 y.o.   MRN: [id number]  Chief Complaint  Patient presents with   Annual Exam    HPI Patient is in today for a comprehensive physical exam  Allergies: She reports that symptoms are controlled. She is currently taking 10 mg of Singulair and 180 mg of Allegra.   Thyroid: She is currently taking 25 mcg of Synthroid and denies of any significant side effects.  Lab Results  Component Value Date   TSH 0.91 12/23/2021   Bone Density: She is currently taking calcium supplements twice a day and is no longer taking the 70 mg of Fosamax.   Vitamin D: Her vitamin D levels are normal. She is currently taking 1000 units of Vitamin D3 daily.  Constipation: She reports of occasional constipation. She is maintaining a healthy diet to alleviate symptoms. She is inquiring what to do if symptoms reoccur.   Left Ear Pain: She reports of chronic left ear pain.   Arthritis: She reports of some arthritis primarily in her hands.   Dermatologist: She reports that she is scheduled for a skin check early next year.   She denies having any fever, hearing or vision symptoms, new muscle pain, joint pain , new moles, rashes, congestion, sinus pain, sore throat, palpations, cough, SOB ,wheezing,n/v/d constipation, blood in stool, dysuria, frequency, hematuria, depression, anxiety, headaches at this time  Social history: She reports no recent surgeries. She denies of any changes to her family medical history. She reports that her maternal grandmother passed away from leukemia. Her maternal grandfather passed from liver cancer. Her paternal grandmother passed from old age. Her sister has melanoma on her back and passed away from a myocardial infarction Colonoscopy:  Last completed on 09/04/2012 Dexa: Last completed on 07/02/2021 Mammogram: Last completed on 07/25/2021. Her mammogram is scheduled for 07/08/2022. Immunizations: She reports that she received the Influenza and Covid vaccine on 04/25/2022. She is also UTD on her RSV vaccine. She is interested in receiving an HIV/HepC vaccine.  Diet: She is maintaining a healthy diet. She has a gluten intolerance which contributes to her healthier diet. She tries to eat a meatless diet at least once or twice a week. She also consumes salmon once a weel Exercise: She is regularly exercising.   Health Maintenance Due  Topic Date Due   Hepatitis C Screening  Never done   COVID-19 Vaccine (8 - 2023-24 season) 06/20/2022    Past Medical History:  Diagnosis Date   Allergy    Anemia    Arthritis    Hyperlipidemia    Family Hx   Thyroid disease     Past Surgical History:  Procedure Laterality Date   APPENDECTOMY     BREAST EXCISIONAL BIOPSY Left    x 2   BREAST LUMPECTOMY WITH AXILLARY LYMPH NODE BIOPSY     HERNIA REPAIR     KNEE ARTHROSCOPY Left     Family History  Problem Relation Age of Onset   Heart disease Mother        MI at age 52, smoker   Hypertension Mother    Kidney disease Father    Kidney cancer Father    Heart disease Sister        heart valve replacement--2016   Diabetes  type II Sister    Other Sister        antisynthetase Syndrome   Heart attack Sister    Melanoma Sister    Leukemia Maternal Grandmother    Liver cancer Maternal Grandfather     Social History   Socioeconomic History   Marital status: Married    Spouse name: Not on file   Number of children: Not on file   Years of education: Not on file   Highest education level: Not on file  Occupational History   Not on file  Tobacco Use   Smoking status: Never   Smokeless tobacco: Never  Vaping Use   Vaping Use: Never used  Substance and Sexual Activity   Alcohol use: Yes    Alcohol/week: 1.0 - 2.0 standard  drink of alcohol    Types: 1 - 2 Glasses of wine per week   Drug use: Never   Sexual activity: Not on file  Other Topics Concern   Not on file  Social History Narrative   She is unemployed,  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     Enjoys traveling, reading cooking, spending time with family   Married    Social Determinants of Health   Financial Resource Strain: Low Risk  (02/27/2022)   Overall Financial Resource Strain (CARDIA)    Difficulty of Paying Living Expenses: Not hard at all  Food Insecurity: No Food Insecurity (02/27/2022)   Hunger Vital Sign    Worried About Running Out of Food in the Last Year: Never true    Ran Out of Food in the Last Year: Never true  Transportation Needs: No Transportation Needs (02/27/2022)   PRAPARE - Hydrologist (Medical): No    Lack of Transportation (Non-Medical): No  Physical Activity: Sufficiently Active (02/27/2022)   Exercise Vital Sign    Days of Exercise per Week: 7 days    Minutes of Exercise per Session: 60 min  Stress: No Stress Concern Present (02/27/2022)   [place]    Feeling of Stress : Not at all  Social Connections: [place] (02/27/2022)   Social Connection and Isolation Panel [NHANES]    Frequency of Communication with Friends and Family: More than three times a week    Frequency of Social Gatherings with Friends and Family: More than three times a week    Attends Religious Services: More than 4 times per year    Active Member of [employer] or Organizations: Yes    Attends Music therapist: More than 4 times per year    Marital Status: Married  Human resources officer Violence: Not At Risk (02/27/2022)   Humiliation, Afraid, Rape, and Kick questionnaire    Fear of Current or Ex-Partner: No    Emotionally Abused: No    Physically  Abused: No    Sexually Abused: No    Outpatient Medications Prior to Visit  Medication Sig Dispense Refill   alendronate (FOSAMAX) 70 MG tablet Take 1 tablet (70 mg total) by mouth every 7 (seven) days. Take with a full glass of water on an empty stomach. 12 tablet 4   Biotin 1 MG CAPS Take by mouth.     cholecalciferol (VITAMIN D3) 25 MCG (1000 UT) tablet Take 1,000 Units by mouth daily.     fexofenadine (ALLEGRA) 180 MG tablet  1 tablet     fluticasone (FLONASE) 50 MCG/ACT nasal spray Place 2 sprays into both nostrils daily. 16 g 6   influenza vaccine adjuvanted (FLUAD) 0.5 ML injection Inject into the muscle. 0.5 mL 0   levothyroxine (SYNTHROID) 25 MCG tablet TAKE 1/2 TABLET(12.5 MCG) BY MOUTH DAILY BEFORE AND BREAKFAST 45 tablet 1   montelukast (SINGULAIR) 10 MG tablet TAKE 1 TABLET(10 MG) BY MOUTH AT BEDTIME 90 tablet 1   traZODone (DESYREL) 50 MG tablet TAKE 1/2 TO 1 TABLET(25 TO 50 MG) BY MOUTH AT BEDTIME AS NEEDED FOR SLEEP 90 tablet 1   No facility-administered medications prior to visit.    Allergies  Allergen Reactions   Oxycodone Itching and Rash    Scratched herself until she bled   Penicillins Rash    Augmentin    Review of Systems  Constitutional:  Negative for fever.  HENT:  Positive for ear pain (Left). Negative for congestion, sinus pain and sore throat.   Respiratory:  Negative for cough, shortness of breath and wheezing.   Cardiovascular:  Negative for palpitations.  Gastrointestinal:  Positive for constipation. Negative for blood in stool, diarrhea, nausea and vomiting.  Genitourinary:  Negative for dysuria, frequency and hematuria.  Musculoskeletal:  Negative for joint pain and myalgias.  Skin:  Negative for rash.       (-) [place]  Neurological:  Negative for headaches.  Psychiatric/Behavioral:  Negative for depression. The patient is not nervous/anxious.       Objective:    Physical Exam Constitutional:      General: She is not in acute  distress.    Appearance: Normal appearance. She is not ill-appearing.  HENT:     Head: Normocephalic and atraumatic.     Right Ear: Tympanic membrane, ear canal and external ear normal.     Left Ear: Tympanic membrane, ear canal and external ear normal.     Ears:     Comments: Clear fluid behind TM (Left)  Eyes:     Extraocular Movements: Extraocular movements intact.     Pupils: Pupils are equal, round, and reactive to light.  Cardiovascular:     Rate and Rhythm: Normal rate and regular rhythm.     Heart sounds: Normal heart sounds. No murmur heard.    No gallop.  Pulmonary:     Effort: Pulmonary effort is normal. No respiratory distress.     Breath sounds: Normal breath sounds. No wheezing or rales.  Abdominal:     General: Bowel sounds are normal. There is no distension.     Palpations: Abdomen is soft.     Tenderness: There is no abdominal tenderness. There is no guarding.  Musculoskeletal:     Comments: 5/5 strength in both upper and lower extremities    Skin:    General: Skin is warm and dry.  Neurological:     Mental Status: She is alert and oriented to person, place, and time.     Deep Tendon Reflexes:     Reflex Scores:      Patellar reflexes are 2+ on the right side and 2+ on the left side. Psychiatric:        Mood and Affect: Mood normal.        Behavior: Behavior normal.        Judgment: Judgment normal.     BP (!) 140/50   Pulse 65   Temp (!) 97.5 F (36.4 C)   Resp 18   Ht _0  (1.651 m)  Wt 143 lb 9.6 oz (65.1 kg)   SpO2 100%   BMI 23.90 kg/m  Wt Readings from Last 3 Encounters:  06/27/22 143 lb 9.6 oz (65.1 kg)  12/23/21 140 lb (63.5 kg)  06/24/21 137 lb (62.1 kg)       Assessment & Plan:   Problem List Items Addressed This Visit       Unprioritized   Seasonal allergies    Stable on singulair, allegra and flonase.       Preventative health care - Primary    Continue healthy diet and regular exercise. Mammo scheduled. Colo will be due  next year. Immunizations reviewed and up to date.       Hypothyroid    Lab Results  Component Value Date   TSH 0.91 12/23/2021  Clinically stable on synthroid, check tsh.       Relevant Orders   TSH   Hyperlipidemia   Relevant Orders   Lipid panel   Comp Met (CMET)   Other Visit Diagnoses     Encounter for hepatitis C screening test for low risk patient       Relevant Orders   Hepatitis C Antibody      No orders of the defined types were placed in this encounter.   I, [name], NP, personally preformed the services described in this documentation.  All medical record entries made by the scribe were at my direction and in my presence.  I have reviewed the chart and discharge instructions (if applicable) and agree that the record reflects my personal performance and is accurate and complete. 06/27/2022   I,Amber Collins,acting as a scribe for [name], NP.,have documented all relevant documentation on the behalf of [name], NP,as directed by  [name], NP while in the presence of [name], NP.    [name], NP

## 2022-06-27 NOTE — Assessment & Plan Note (Signed)
Lab Results  Component Value Date   TSH 0.91 12/23/2021   Clinically stable on synthroid, check tsh.

## 2022-06-27 NOTE — Assessment & Plan Note (Signed)
Stable on singulair, allegra and flonase.

## 2022-06-28 LAB — TSH: TSH: 1.01 u[IU]/mL (ref 0.35–5.50)

## 2022-07-08 ENCOUNTER — Ambulatory Visit: Payer: PPO

## 2022-07-10 ENCOUNTER — Ambulatory Visit
Admission: RE | Admit: 2022-07-10 | Discharge: 2022-07-10 | Disposition: A | Discharge status: Home / Self Care | Payer: PPO | Source: Ambulatory Visit | Attending: Family | Admitting: Family

## 2022-07-10 DIAGNOSIS — Z1231 Encounter for screening mammogram for malignant neoplasm of breast: Secondary | ICD-10-CM

## 2022-09-04 ENCOUNTER — Other Ambulatory Visit: Payer: Self-pay

## 2022-09-04 MED ORDER — LEVOTHYROXINE SODIUM 25 MCG PO TABS: ORAL_TABLET | ORAL | 1 refills | Status: DC

## 2022-09-09 DIAGNOSIS — L648 Other androgenic alopecia: Secondary | ICD-10-CM | POA: Diagnosis not present

## 2022-09-11 DIAGNOSIS — L820 Inflamed seborrheic keratosis: Secondary | ICD-10-CM | POA: Diagnosis not present

## 2022-09-11 DIAGNOSIS — D2261 Melanocytic nevi of right upper limb, including shoulder: Secondary | ICD-10-CM | POA: Diagnosis not present

## 2022-09-11 DIAGNOSIS — L814 Other melanin hyperpigmentation: Secondary | ICD-10-CM | POA: Diagnosis not present

## 2022-09-11 DIAGNOSIS — D225 Melanocytic nevi of trunk: Secondary | ICD-10-CM | POA: Diagnosis not present

## 2022-09-11 DIAGNOSIS — D2262 Melanocytic nevi of left upper limb, including shoulder: Secondary | ICD-10-CM | POA: Diagnosis not present

## 2022-11-14 ENCOUNTER — Ambulatory Visit (INDEPENDENT_AMBULATORY_CARE_PROVIDER_SITE_OTHER): Payer: PPO | Admitting: Family

## 2022-11-14 VITALS — BP 146/64 | HR 76 | Temp 97.6°F | Resp 18 | Ht 65.0 in | Wt 139.0 lb

## 2022-11-14 DIAGNOSIS — R030 Elevated blood-pressure reading, without diagnosis of hypertension: Secondary | ICD-10-CM | POA: Diagnosis not present

## 2022-11-14 DIAGNOSIS — N950 Postmenopausal bleeding: Secondary | ICD-10-CM | POA: Diagnosis not present

## 2022-11-14 NOTE — Assessment & Plan Note (Signed)
New. Will refer to GYN for further evaluation.  

## 2022-11-14 NOTE — Assessment & Plan Note (Signed)
BP Readings from Last 3 Encounters:  11/14/22 (!) 146/64  06/27/22 (!) 140/50  12/23/21 (!) 121/59   I think she was anxious today about bleeding concern.  She returns in July for CPE. If still elevated at that time will plan to start antihypertensive.

## 2022-11-14 NOTE — Progress Notes (Signed)
Subjective:   By signing my name below, I, Kara Robinson, attest that this documentation has been prepared under the direction and in the presence of Kara Fillers, NP 11/14/22   Patient ID: Kara Robinson, female    DOB: 07/26/1955, 67 y.o.   MRN: [id number]  Chief Complaint  Patient presents with   Vaginal Bleeding    HPI Patient is in today for an office visit.   Vaginal Bleeding:  She reports light vaginal blood yesterday. She describes the blood as a light pinkish color. Her last pap was 05/20/2019. She reports recently having intercourse.   Past Medical History:  Diagnosis Date   Allergy    Anemia    Arthritis    Hyperlipidemia    Family Hx   Thyroid disease     Past Surgical History:  Procedure Laterality Date   APPENDECTOMY     BREAST EXCISIONAL BIOPSY Left    x 2   BREAST LUMPECTOMY WITH AXILLARY LYMPH NODE BIOPSY     HERNIA REPAIR     KNEE ARTHROSCOPY Left     Family History  Problem Relation Age of Onset   Heart disease Mother        MI at age 72, smoker   Hypertension Mother    Kidney disease Father    Kidney cancer Father    Heart disease Sister        heart valve replacement--2016   Diabetes type II Sister    Other Sister        antisynthetase Syndrome   Heart attack Sister    Melanoma Sister    Leukemia Maternal Grandmother    Liver cancer Maternal Grandfather     Social History   Socioeconomic History   Marital status: Married    Spouse name: Not on file   Number of children: Not on file   Years of education: Not on file   Highest education level: Not on file  Occupational History   Not on file  Tobacco Use   Smoking status: Never   Smokeless tobacco: Never  Vaping Use   Vaping Use: Never used  Substance and Sexual Activity   Alcohol use: Yes    Alcohol/week: 1.0 - 2.0 standard drink of alcohol    Types: 1 - 2 Glasses of wine per week   Drug use: Never   Sexual activity: Not on file  Other Topics Concern   Not  on file  Social History Narrative   She is unemployed,  Used to work as a Production designer, theatre/television/film at a bank   3 daughters, 7 grandchildren 1 great grandson (all local) moved from [place] 20 years ago.     Enjoys traveling, reading cooking, spending time with family   Married    Social Determinants of Health   Financial Resource Strain: Low Risk  (02/27/2022)   Overall Financial Resource Strain (CARDIA)    Difficulty of Paying Living Expenses: Not hard at all  Food Insecurity: No Food Insecurity (02/27/2022)   Hunger Vital Sign    Worried About Running Out of Food in the Last Year: Never true    Ran Out of Food in the Last Year: Never true  Transportation Needs: No Transportation Needs (02/27/2022)   PRAPARE - Administrator, Civil Service (Medical): No    Lack of Transportation (Non-Medical): No  Physical Activity: Sufficiently Active (02/27/2022)   Exercise Vital Sign    Days of Exercise per Week: 7 days    Minutes  of Exercise per Session: 60 min  Stress: No Stress Concern Present (02/27/2022)   [employer] of Occupational Health - Occupational Stress Questionnaire    Feeling of Stress : Not at all  Social Connections: [place] (02/27/2022)   Social Connection and Isolation Panel [NHANES]    Frequency of Communication with Friends and Family: More than three times a week    Frequency of Social Gatherings with Friends and Family: More than three times a week    Attends Religious Services: More than 4 times per year    Active Member of [employer] or Organizations: Yes    Attends Engineer, structural: More than 4 times per year    Marital Status: Married  Catering manager Violence: Not At Risk (02/27/2022)   Humiliation, Afraid, Rape, and Kick questionnaire    Fear of Current or Ex-Partner: No    Emotionally Abused: No    Physically Abused: No    Sexually Abused: No    Outpatient Medications Prior to Visit  Medication Sig Dispense Refill   alendronate (FOSAMAX) 70  MG tablet Take 1 tablet (70 mg total) by mouth every 7 (seven) days. Take with a full glass of water on an empty stomach. 12 tablet 4   Biotin 1 MG CAPS Take by mouth.     cholecalciferol (VITAMIN D3) 25 MCG (1000 UT) tablet Take 1,000 Units by mouth daily.     fexofenadine (ALLEGRA) 180 MG tablet 1 tablet     fluticasone (FLONASE) 50 MCG/ACT nasal spray Place 2 sprays into both nostrils daily. 16 g 6   levothyroxine (SYNTHROID) 25 MCG tablet TAKE 1/2 TABLET(12.5 MCG) BY MOUTH DAILY BEFORE AND BREAKFAST 45 tablet 1   montelukast (SINGULAIR) 10 MG tablet TAKE 1 TABLET(10 MG) BY MOUTH AT BEDTIME 90 tablet 1   traZODone (DESYREL) 50 MG tablet TAKE 1/2 TO 1 TABLET(25 TO 50 MG) BY MOUTH AT BEDTIME AS NEEDED FOR SLEEP 90 tablet 1   influenza vaccine adjuvanted (FLUAD) 0.5 ML injection Inject into the muscle. 0.5 mL 0   No facility-administered medications prior to visit.    Allergies  Allergen Reactions   Oxycodone Itching and Rash    Scratched herself until she bled   Penicillins Rash    Augmentin    ROS See HPI    Objective:    Physical Exam Exam conducted with a chaperone present.  Constitutional:      Appearance: Normal appearance.  Abdominal:     Hernia: There is no hernia in the left inguinal area or right inguinal area.  Genitourinary:    Exam position: Lithotomy position.     Pubic Area: No rash.      Labia:        Right: No rash.        Left: No rash.      Vagina: Normal.     Cervix: Cervical bleeding (slightly blood tinged mucous at cervical Os.) present.     Uterus: Normal.      Adnexa: Right adnexa normal and left adnexa normal.  Neurological:     Mental Status: She is alert.  Psychiatric:        Mood and Affect: Mood normal.        Behavior: Behavior normal.        Thought Content: Thought content normal.        Judgment: Judgment normal.     BP (!) 146/64   Pulse 76   Temp 97.6 F (36.4 C)  Resp 18   Ht 5\' 5"  (1.651 m)   Wt 139 lb (63 kg)   SpO2  100%   BMI 23.13 kg/m  Wt Readings from Last 3 Encounters:  11/14/22 139 lb (63 kg)  06/27/22 143 lb 9.6 oz (65.1 kg)  12/23/21 140 lb (63.5 kg)       Assessment & Plan:  Post-menopausal bleeding Assessment & Plan: New.  Will refer to GYN for further evaluation.   Orders: -     Ambulatory referral to Obstetrics / Gynecology  Elevated blood pressure reading Assessment & Plan: BP Readings from Last 3 Encounters:  11/14/22 (!) 146/64  06/27/22 (!) 140/50  12/23/21 (!) 121/59   I think she was anxious today about bleeding concern.  She returns in July for CPE. If still elevated at that time will plan to start antihypertensive.       I,Rachel Rivera,acting as a Neurosurgeon for Kara Fillers, NP.,have documented all relevant documentation on the behalf of Kara Fillers, NP,as directed by  Kara Fillers, NP while in the presence of Kara Fillers, NP.   I, Kara Fillers, NP, personally preformed the services described in this documentation.  All medical record entries made by the scribe were at my direction and in my presence.  I have reviewed the chart and discharge instructions (if applicable) and agree that the record reflects my personal performance and is accurate and complete. 11/14/22   Kara Fillers, NP

## 2022-12-09 ENCOUNTER — Telehealth: Payer: Self-pay | Admitting: Family

## 2022-12-09 MED ORDER — MONTELUKAST SODIUM 10 MG PO TABS: ORAL_TABLET | ORAL | 1 refills | Status: DC

## 2022-12-09 MED ORDER — TRAZODONE HCL 50 MG PO TABS: ORAL_TABLET | ORAL | 1 refills | Status: DC

## 2022-12-09 NOTE — Telephone Encounter (Signed)
Refills sent

## 2022-12-09 NOTE — Telephone Encounter (Signed)
Prescription Request  12/09/2022  Is this a "Controlled Substance" medicine? No  LOV: 11/14/2022  What is the name of the medication or equipment? traZODone (DESYREL) 50 MG tablet   montelukast (SINGULAIR) 10 MG tablet    Have you contacted your pharmacy to request a refill? Yes - They need new prescription to be sent in since it's a new pharmacy.   Which pharmacy would you like this sent to?  [id number] - [place], [place] - [address] [place] [address] Phone: [phone] Fax: [phone]    Patient notified that their request is being sent to the clinical staff for review and that they should receive a response within 2 business days.   Please advise at Mobile [phone] (mobile)

## 2022-12-09 NOTE — Addendum Note (Signed)
Addended by: Wilford Corner on: 12/09/2022 05:53 PM   Modules accepted: Orders

## 2022-12-10 ENCOUNTER — Other Ambulatory Visit: Payer: Self-pay | Admitting: Family

## 2022-12-24 ENCOUNTER — Ambulatory Visit: Payer: PPO | Admitting: Family

## 2022-12-29 ENCOUNTER — Ambulatory Visit: Payer: PPO | Admitting: Family

## 2022-12-31 ENCOUNTER — Ambulatory Visit (INDEPENDENT_AMBULATORY_CARE_PROVIDER_SITE_OTHER): Payer: PPO | Admitting: Family

## 2022-12-31 VITALS — BP 118/61 | HR 68 | Temp 97.5°F | Resp 16 | Wt 138.0 lb

## 2022-12-31 DIAGNOSIS — N950 Postmenopausal bleeding: Secondary | ICD-10-CM | POA: Diagnosis not present

## 2022-12-31 DIAGNOSIS — E039 Hypothyroidism, unspecified: Secondary | ICD-10-CM | POA: Diagnosis not present

## 2022-12-31 DIAGNOSIS — E785 Hyperlipidemia, unspecified: Secondary | ICD-10-CM

## 2022-12-31 DIAGNOSIS — G4700 Insomnia, unspecified: Secondary | ICD-10-CM | POA: Diagnosis not present

## 2022-12-31 DIAGNOSIS — M25512 Pain in left shoulder: Secondary | ICD-10-CM

## 2022-12-31 DIAGNOSIS — H6692 Otitis media, unspecified, left ear: Secondary | ICD-10-CM | POA: Diagnosis not present

## 2022-12-31 DIAGNOSIS — J302 Other seasonal allergic rhinitis: Secondary | ICD-10-CM

## 2022-12-31 DIAGNOSIS — Z1211 Encounter for screening for malignant neoplasm of colon: Secondary | ICD-10-CM

## 2022-12-31 LAB — LIPID PANEL
Cholesterol: 224 mg/dL — ABNORMAL HIGH (ref 0–200)
HDL: 90.9 mg/dL (ref >39.00)
LDL Cholesterol: 118 mg/dL — ABNORMAL HIGH (ref 0–99)
NonHDL: 133.06
Total CHOL/HDL Ratio: 2
Triglycerides: 75 mg/dL (ref 0.0–149.0)
VLDL: 15 mg/dL (ref 0.0–40.0)

## 2022-12-31 LAB — TSH: TSH: 0.87 u[IU]/mL (ref 0.35–5.50)

## 2022-12-31 MED ORDER — MELOXICAM 7.5 MG PO TABS: 7.5000 mg | ORAL_TABLET | Freq: Every day | ORAL | 0 refills | Status: DC

## 2022-12-31 MED ORDER — AZITHROMYCIN 250 MG PO TABS: ORAL_TABLET | ORAL | 0 refills | Status: AC

## 2022-12-31 NOTE — Assessment & Plan Note (Signed)
Stable on current medication, last thyroid level checked six months ago. -Check thyroid level today.

## 2022-12-31 NOTE — Assessment & Plan Note (Signed)
Stable on trazodone. Continue same. 

## 2022-12-31 NOTE — Assessment & Plan Note (Signed)
Single episode, no recurrence. -Keep GYN appointment on July 15th for further evaluation.

## 2022-12-31 NOTE — Assessment & Plan Note (Signed)
Likely rotator cuff strain, improved but still present after a month. No significant swelling or limitation in range of motion. -trial of meloxicam 7.5mg  once daily -Consider referral to sports medicine if no further improvement in 1-2 weeks. -Avoid arm exercises for the next week to allow for rest and recovery.

## 2022-12-31 NOTE — Assessment & Plan Note (Addendum)
Stable on current regimen of Singulair, Allegra, and Flonase. -Consider trial off Singulair to assess necessity.

## 2022-12-31 NOTE — Progress Notes (Signed)
Subjective:     Patient ID: Kara Robinson, female    DOB: 29-Oct-1955, 67 y.o.   MRN: [id number]  Chief Complaint  Patient presents with   Hypothyroidism    Here for follow up   Shoulder Pain    Patient complains of left shoulder pain    HPI  Discussed the use of AI scribe software for clinical note transcription with the patient, who gave verbal consent to proceed.  History of Present Illness   Kara Robinson, a patient with a history of thyroid disease, insomnia, and allergies, presents for a follow-up visit. She reports no further episodes of vaginal bleeding since her last visit and is scheduled for a GYN appointment. Her blood pressure is well-controlled, and she reports feeling well on her current thyroid medication. She uses trazodone nightly for insomnia, which she reports has been effective in managing her symptoms. Her allergies are well-controlled with Singulair, Allegra, and Flonase, though she reports some residual congestion.  She also presents with left shoulder pain that started about a month ago after exercising with weights. The pain has improved but persists, and she has been managing it with ibuprofen and ice. She also reports left ear pain, which is a new symptom.          Health Maintenance Due  Topic Date Due   COVID-19 Vaccine (8 - 2023-24 season) 06/20/2022   Colonoscopy  09/05/2022   Medicare Annual Wellness (AWV)  02/28/2023    Past Medical History:  Diagnosis Date   Allergy    Anemia    Arthritis    Hyperlipidemia    Family Hx   Thyroid disease     Past Surgical History:  Procedure Laterality Date   APPENDECTOMY     BREAST EXCISIONAL BIOPSY Left    x 2   BREAST LUMPECTOMY WITH AXILLARY LYMPH NODE BIOPSY     HERNIA REPAIR     KNEE ARTHROSCOPY Left     Family History  Problem Relation Age of Onset   Heart disease Mother        MI at age 45, smoker   Hypertension Mother    Kidney disease Father    Kidney cancer Father    Heart  disease Sister        heart valve replacement--2016   Diabetes type II Sister    Other Sister        antisynthetase Syndrome   Heart attack Sister    Melanoma Sister    Leukemia Maternal Grandmother    Liver cancer Maternal Grandfather     Social History   Socioeconomic History   Marital status: Married    Spouse name: Not on file   Number of children: Not on file   Years of education: Not on file   Highest education level: Not on file  Occupational History   Not on file  Tobacco Use   Smoking status: Never   Smokeless tobacco: Never  Vaping Use   Vaping Use: Never used  Substance and Sexual Activity   Alcohol use: Yes    Alcohol/week: 1.0 - 2.0 standard drink of alcohol    Types: 1 - 2 Glasses of wine per week   Drug use: Never   Sexual activity: Not on file  Other Topics Concern   Not on file  Social History Narrative   She is unemployed,  Used to work as a Production designer, theatre/television/film at a bank   3 daughters, 7 grandchildren 1 great grandson (all local) moved from  [place] 20 years ago.     Enjoys traveling, reading cooking, spending time with family   Married    Social Determinants of Health   Financial Resource Strain: Low Risk  (02/27/2022)   Overall Financial Resource Strain (CARDIA)    Difficulty of Paying Living Expenses: Not hard at all  Food Insecurity: No Food Insecurity (02/27/2022)   Hunger Vital Sign    Worried About Running Out of Food in the Last Year: Never true    Ran Out of Food in the Last Year: Never true  Transportation Needs: No Transportation Needs (02/27/2022)   PRAPARE - Administrator, Civil Service (Medical): No    Lack of Transportation (Non-Medical): No  Physical Activity: Sufficiently Active (02/27/2022)   Exercise Vital Sign    Days of Exercise per Week: 7 days    Minutes of Exercise per Session: 60 min  Stress: No Stress Concern Present (02/27/2022)   [employer] of Occupational Health - Occupational Stress Questionnaire    Feeling of  Stress : Not at all  Social Connections: Socially Integrated (02/27/2022)   Social Connection and Isolation Panel [NHANES]    Frequency of Communication with Friends and Family: More than three times a week    Frequency of Social Gatherings with Friends and Family: More than three times a week    Attends Religious Services: More than 4 times per year    Active Member of [employer] or Organizations: Yes    Attends Engineer, structural: More than 4 times per year    Marital Status: Married  Catering manager Violence: Not At Risk (02/27/2022)   Humiliation, Afraid, Rape, and Kick questionnaire    Fear of Current or Ex-Partner: No    Emotionally Abused: No    Physically Abused: No    Sexually Abused: No    Outpatient Medications Prior to Visit  Medication Sig Dispense Refill   alendronate (FOSAMAX) 70 MG tablet Take 1 tablet (70 mg total) by mouth every 7 (seven) days. Take with a full glass of water on an empty stomach. 12 tablet 4   Biotin 1 MG CAPS Take by mouth.     cholecalciferol (VITAMIN D3) 25 MCG (1000 UT) tablet Take 1,000 Units by mouth daily.     fexofenadine (ALLEGRA) 180 MG tablet 1 tablet     fluticasone (FLONASE) 50 MCG/ACT nasal spray Place 2 sprays into both nostrils daily. 16 g 6   levothyroxine (SYNTHROID) 25 MCG tablet TAKE 1/2 TABLET(12.5 MCG) BY MOUTH DAILY BEFORE AND BREAKFAST 45 tablet 1   montelukast (SINGULAIR) 10 MG tablet TAKE 1 TABLET(10 MG) BY MOUTH AT BEDTIME 90 tablet 1   traZODone (DESYREL) 50 MG tablet TAKE 1/2 TO 1 TABLET(25 TO 50 MG) BY MOUTH AT BEDTIME AS NEEDED FOR SLEEP 90 tablet 1   No facility-administered medications prior to visit.    Allergies  Allergen Reactions   Oxycodone Itching and Rash    Scratched herself until she bled   Penicillins Rash    Augmentin    ROS     Objective:    Physical Exam Constitutional:      General: She is not in acute distress.    Appearance: Normal appearance. She is well-developed.  HENT:      Head: Normocephalic and atraumatic.     Right Ear: Tympanic membrane, ear canal and external ear normal.     Left Ear: Ear canal and external ear normal. Tympanic membrane is erythematous and retracted.  Eyes:  General: No scleral icterus. Neck:     Thyroid: No thyromegaly.  Cardiovascular:     Rate and Rhythm: Normal rate and regular rhythm.     Heart sounds: Normal heart sounds. No murmur heard. Pulmonary:     Effort: Pulmonary effort is normal. No respiratory distress.     Breath sounds: Normal breath sounds. No wheezing.  Musculoskeletal:     Cervical back: Neck supple.     Comments: No shoulder swelling Mild discomfort with "empty can" test and left arm abduction  Skin:    General: Skin is warm and dry.  Neurological:     Mental Status: She is alert and oriented to person, place, and time.  Psychiatric:        Mood and Affect: Mood normal.        Behavior: Behavior normal.        Thought Content: Thought content normal.        Judgment: Judgment normal.      BP 118/61 (BP Location: Right Arm, Patient Position: Sitting, Cuff Size: Small)   Pulse 68   Temp (!) 97.5 F (36.4 C) (Oral)   Resp 16   Wt 138 lb (62.6 kg)   SpO2 100%   BMI 22.96 kg/m  Wt Readings from Last 3 Encounters:  12/31/22 138 lb (62.6 kg)  11/14/22 139 lb (63 kg)  06/27/22 143 lb 9.6 oz (65.1 kg)       Assessment & Plan:   Problem List Items Addressed This Visit       Unprioritized   Seasonal allergies    Overall stable.  Continue same. She wishes to trial off of singulair.       Post-menopausal bleeding     Single episode, no recurrence. -Keep GYN appointment on July 15th for further evaluation.      Left otitis media   Relevant Medications   azithromycin (ZITHROMAX) 250 MG tablet   Insomnia    Stable on trazodone.  Continue same.       Hypothyroid - Primary    Stable on current medication, last thyroid level checked six months ago. -Check thyroid level today.       Relevant Orders   TSH   Hyperlipidemia   Relevant Orders   Lipid panel   Acute pain of left shoulder     Likely rotator cuff strain, improved but still present after a month. No significant swelling or limitation in range of motion. -trial of meloxicam 7.5mg  once daily -Consider referral to sports medicine if no further improvement in 1-2 weeks. -Avoid arm exercises for the next week to allow for rest and recovery.      Other Visit Diagnoses     Screening for colon cancer       Relevant Orders   Ambulatory referral to Gastroenterology       Colon Cancer Screening: Last colonoscopy in 2014. -Place referral to [facility] for colonoscopy.  General Health Maintenance: -Continue Mediterranean diet as discussed. -Return in six months for routine follow-up.  I am having Brannon A. Autrey start on azithromycin and meloxicam. I am also having her maintain her fluticasone, fexofenadine, Biotin, cholecalciferol, alendronate, levothyroxine, traZODone, and montelukast.  Meds ordered this encounter  Medications   azithromycin (ZITHROMAX) 250 MG tablet    Sig: Take 2 tablets on day 1, then 1 tablet daily on days 2 through 5    Dispense:  6 tablet    Refill:  0    Order Specific Question:  Supervising Provider    Answer:   Danise Edge A [[id number]]   meloxicam (MOBIC) 7.5 MG tablet    Sig: Take 1 tablet (7.5 mg total) by mouth daily.    Dispense:  14 tablet    Refill:  0    Order Specific Question:   Supervising Provider    Answer:   Danise Edge A [[id number]]

## 2023-01-19 ENCOUNTER — Other Ambulatory Visit (HOSPITAL_COMMUNITY)
Admission: RE | Admit: 2023-01-19 | Discharge: 2023-01-19 | Disposition: A | Discharge status: Home / Self Care | Payer: PPO | Source: Ambulatory Visit | Attending: Obstetrics and Gynecology | Admitting: Obstetrics and Gynecology

## 2023-01-19 ENCOUNTER — Ambulatory Visit: Payer: PPO | Admitting: Obstetrics and Gynecology

## 2023-01-19 ENCOUNTER — Encounter: Payer: Self-pay | Admitting: Obstetrics and Gynecology

## 2023-01-19 VITALS — BP 124/65 | HR 72 | Ht 65.0 in | Wt 142.0 lb

## 2023-01-19 DIAGNOSIS — N858 Other specified noninflammatory disorders of uterus: Secondary | ICD-10-CM | POA: Diagnosis not present

## 2023-01-19 DIAGNOSIS — N950 Postmenopausal bleeding: Secondary | ICD-10-CM | POA: Insufficient documentation

## 2023-01-19 NOTE — Progress Notes (Signed)
Patient states that she had one episode of PMP bleeding six weeks ago. Armandina Stammer RN

## 2023-01-19 NOTE — Progress Notes (Signed)
NEW GYNECOLOGY PATIENT Patient name: Kara Robinson MRN [id number]  Date of birth: 1956/06/25 Chief Complaint:   No chief complaint on file.     History:  Kara Robinson is a 67 y.o. G3P3 being seen today for PMB.  Had a singular episidoe of light pink with tissue once. No pian with that bleeding. Had had sex eaerlier that day; and since has had sexaul activity without any issues   Menopause at 47. Went to primary care and had exam and had nothing seen      Gynecologic History No LMP recorded. Patient is postmenopausal. Contraception: post menopausal status Last Pap:     Component Value Date/Time   DIAGPAP  05/20/2019 0849    - Negative for intraepithelial lesion or malignancy (NILM)   HPVHIGH Negative 05/20/2019 0849   ADEQPAP  05/20/2019 0849    Satisfactory for evaluation. The presence or absence of an   ADEQPAP  05/20/2019 0849    endocervical/transformation zone component cannot be determined because   ADEQPAP of atrophy. 05/20/2019 0849    High Risk HPV: Positive  Adequacy:  Satisfactory for evaluation, transformation zone component PRESENT  Diagnosis:  Atypical squamous cells of undetermined significance (ASC-US)  Last Mammogram: 07/2022 BIRADS 1 Last Colonoscopy: 2014  Obstetric History OB History  Gravida Para Term Preterm AB Living  3 3       3   SAB IAB Ectopic Multiple Live Births          3    # Outcome Date GA Lbr Len/2nd Weight Sex Type Anes PTL Lv  3 Para      Vag-Spont     2 Para      Vag-Spont     1 Para      Vag-Spont       Past Medical History:  Diagnosis Date   Allergy    Anemia    Arthritis    Hyperlipidemia    Family Hx   Thyroid disease     Past Surgical History:  Procedure Laterality Date   APPENDECTOMY     BREAST EXCISIONAL BIOPSY Left    x 2   BREAST LUMPECTOMY WITH AXILLARY LYMPH NODE BIOPSY     HERNIA REPAIR     KNEE ARTHROSCOPY Left     Current Outpatient Medications on File Prior to Visit  Medication Sig  Dispense Refill   alendronate (FOSAMAX) 70 MG tablet Take 1 tablet (70 mg total) by mouth every 7 (seven) days. Take with a full glass of water on an empty stomach. 12 tablet 4   Biotin 1 MG CAPS Take by mouth.     cholecalciferol (VITAMIN D3) 25 MCG (1000 UT) tablet Take 1,000 Units by mouth daily.     fexofenadine (ALLEGRA) 180 MG tablet 1 tablet     fluticasone (FLONASE) 50 MCG/ACT nasal spray Place 2 sprays into both nostrils daily. 16 g 6   levothyroxine (SYNTHROID) 25 MCG tablet TAKE 1/2 TABLET(12.5 MCG) BY MOUTH DAILY BEFORE AND BREAKFAST 45 tablet 1   meloxicam (MOBIC) 7.5 MG tablet Take 1 tablet (7.5 mg total) by mouth daily. 14 tablet 0   montelukast (SINGULAIR) 10 MG tablet TAKE 1 TABLET(10 MG) BY MOUTH AT BEDTIME 90 tablet 1   traZODone (DESYREL) 50 MG tablet TAKE 1/2 TO 1 TABLET(25 TO 50 MG) BY MOUTH AT BEDTIME AS NEEDED FOR SLEEP 90 tablet 1   No current facility-administered medications on file prior to visit.    Allergies  Allergen Reactions  Oxycodone Itching and Rash    Scratched herself until she bled   Penicillins Rash    Augmentin    Social History:  reports that she has never smoked. She has never used smokeless tobacco. She reports current alcohol use of about 1.0 - 2.0 standard drink of alcohol per week. She reports that she does not use drugs.  Family History  Problem Relation Age of Onset   Heart disease Mother        MI at age 38, smoker   Hypertension Mother    Kidney disease Father    Kidney cancer Father    Heart disease Sister        heart valve replacement--2016   Diabetes type II Sister    Other Sister        antisynthetase Syndrome   Heart attack Sister    Melanoma Sister    Leukemia Maternal Grandmother    Liver cancer Maternal Grandfather     The following portions of the patient's history were reviewed and updated as appropriate: allergies, current medications, past family history, past medical history, past social history, past surgical  history and problem list.  Review of Systems Pertinent items noted in HPI and remainder of comprehensive ROS otherwise negative.  Physical Exam:  BP 124/65   Pulse 72   Ht 5\' 5"  (1.651 m)   Wt 142 lb (64.4 kg)   BMI 23.63 kg/m  Physical Exam Vitals and nursing note reviewed. Exam conducted with a chaperone present.  Constitutional:      Appearance: Normal appearance.  Cardiovascular:     Rate and Rhythm: Normal rate.  Pulmonary:     Effort: Pulmonary effort is normal.     Breath sounds: Normal breath sounds.  Genitourinary:    General: Normal vulva.     Vagina: Normal.     Cervix: Normal.  Neurological:     General: No focal deficit present.     Mental Status: She is alert and oriented to person, place, and time.  Psychiatric:        Mood and Affect: Mood normal.        Behavior: Behavior normal.        Thought Content: Thought content normal.        Judgment: Judgment normal.    Endometrial Biopsy Procedure  Patient identified, informed consent performed,  indication reviewed, consent signed.  Reviewed risk of perforation, pain, bleeding, insufficient sample, etc were reviewd. Time out was performed.  Urine pregnancy test negative.  Speculum placed in the vagina.  Cervix visualized.  Cleaned with Betadine x 2.  Anterior cervix infiltrated grasped anteriorly with a single tooth tenaculum.  Paracervical block was not administered.  Endometrial pipelle was used to draw up 1cc of 1% lidocaine, introduced into the cervical os and instilled into the endometrial cavity.  The pipelle was passed TWICE without difficulty and sample obtained. Tenaculum was removed, good hemostasis noted.  Patient tolerated procedure well.  Patient was given post-procedure instructions.      Assessment and Plan:   1. Post-menopausal bleeding Now s/p EMB for PMB. Suspect will be benign based on history, will follow up results.  - Surgical pathology    Routine preventative health maintenance  measures emphasized. Please refer to After Visit Summary for other counseling recommendations.   Follow-up: as needed following results     Lorriane Shire, MD Obstetrician & Gynecologist, Faculty [facility] [employer], [facility]

## 2023-01-20 ENCOUNTER — Other Ambulatory Visit: Payer: Self-pay | Admitting: Family

## 2023-01-20 LAB — SURGICAL PATHOLOGY

## 2023-01-20 MED ORDER — MELOXICAM 7.5 MG PO TABS: 7.5000 mg | ORAL_TABLET | Freq: Every day | ORAL | 0 refills | Status: DC

## 2023-02-04 ENCOUNTER — Encounter (INDEPENDENT_AMBULATORY_CARE_PROVIDER_SITE_OTHER): Payer: Self-pay

## 2023-02-15 DIAGNOSIS — U071 COVID-19: Secondary | ICD-10-CM | POA: Diagnosis not present

## 2023-03-03 ENCOUNTER — Other Ambulatory Visit: Payer: Self-pay | Admitting: Family

## 2023-03-05 ENCOUNTER — Ambulatory Visit (INDEPENDENT_AMBULATORY_CARE_PROVIDER_SITE_OTHER): Payer: PPO | Admitting: *Deleted

## 2023-03-05 DIAGNOSIS — Z0000 Encounter for general adult medical examination without abnormal findings: Secondary | ICD-10-CM

## 2023-03-05 NOTE — Progress Notes (Signed)
Subjective:   Kara Robinson is a 67 y.o. female who presents for Medicare Annual (Subsequent) preventive examination.  Visit Complete: Virtual  I connected with  Kara Robinson on 03/05/23 by a audio enabled telemedicine application and verified that I am speaking with the correct person using two identifiers.  Patient Location: Home  Provider Location: Office/Clinic  I discussed the limitations of evaluation and management by telemedicine. The patient expressed understanding and agreed to proceed.   Review of Systems     Cardiac Risk Factors include: advanced age (>68men, >48 women);dyslipidemia     Objective:    Vital Signs: Unable to obtain new vitals due to this being a telehealth visit.      03/05/2023   10:37 AM 02/27/2022    1:43 PM 04/11/2018    8:58 PM  Advanced Directives  Does Patient Have a Medical Advance Directive? Yes Yes No  Type of Estate agent of [place];Living will Healthcare Power of [place];Living will   Does patient want to make changes to medical advance directive?  No - Patient declined   Copy of Healthcare Power of Attorney in Chart? No - copy requested No - copy requested   Would patient like information on creating a medical advance directive?   No - Patient declined    Current Medications (verified) Outpatient Encounter Medications as of 03/05/2023  Medication Sig   alendronate (FOSAMAX) 70 MG tablet Take 1 tablet (70 mg total) by mouth every 7 (seven) days. Take with a full glass of water on an empty stomach.   Biotin 1 MG CAPS Take by mouth.   cholecalciferol (VITAMIN D3) 25 MCG (1000 UT) tablet Take 1,000 Units by mouth daily.   fexofenadine (ALLEGRA) 180 MG tablet 1 tablet   fluticasone (FLONASE) 50 MCG/ACT nasal spray Place 2 sprays into both nostrils daily.   levothyroxine (SYNTHROID) 25 MCG tablet Take 0.5 tablets (12.5 mcg total) by mouth daily before breakfast.   meloxicam (MOBIC) 7.5 MG tablet TAKE 1  TABLET BY MOUTH EVERY DAY   montelukast (SINGULAIR) 10 MG tablet TAKE 1 TABLET(10 MG) BY MOUTH AT BEDTIME   traZODone (DESYREL) 50 MG tablet TAKE 1/2 TO 1 TABLET(25 TO 50 MG) BY MOUTH AT BEDTIME AS NEEDED FOR SLEEP   No facility-administered encounter medications on file as of 03/05/2023.    Allergies (verified) Oxycodone and Penicillins   History: Past Medical History:  Diagnosis Date   Allergy    Anemia    Arthritis    Hyperlipidemia    Family Hx   Thyroid disease    Past Surgical History:  Procedure Laterality Date   APPENDECTOMY     BREAST EXCISIONAL BIOPSY Left    x 2   BREAST LUMPECTOMY WITH AXILLARY LYMPH NODE BIOPSY     HERNIA REPAIR     KNEE ARTHROSCOPY Left    Family History  Problem Relation Age of Onset   Heart disease Mother        MI at age 31, smoker   Hypertension Mother    Kidney disease Father    Kidney cancer Father    Heart disease Sister        heart valve replacement--2016   Diabetes type II Sister    Other Sister        antisynthetase Syndrome   Heart attack Sister    Melanoma Sister    Leukemia Maternal Grandmother    Liver cancer Maternal Grandfather    Social History   Socioeconomic  History   Marital status: Married    Spouse name: Not on file   Number of children: Not on file   Years of education: Not on file   Highest education level: Not on file  Occupational History   Not on file  Tobacco Use   Smoking status: Never   Smokeless tobacco: Never  Vaping Use   Vaping status: Never Used  Substance and Sexual Activity   Alcohol use: Yes    Alcohol/week: 1.0 - 2.0 standard drink of alcohol    Types: 1 - 2 Glasses of wine per week   Drug use: Never   Sexual activity: Yes  Other Topics Concern   Not on file  Social History Narrative   She is unemployed,  Used to work as a Production designer, theatre/television/film at a bank   3 daughters, 7 grandchildren 1 great grandson (all local) moved from [place] 20 years ago.     Enjoys traveling, reading cooking,  spending time with family   Married    Social Determinants of Health   Financial Resource Strain: Low Risk  (03/05/2023)   Overall Financial Resource Strain (CARDIA)    Difficulty of Paying Living Expenses: Not hard at all  Food Insecurity: No Food Insecurity (03/05/2023)   Hunger Vital Sign    Worried About Running Out of Food in the Last Year: Never true    Ran Out of Food in the Last Year: Never true  Transportation Needs: No Transportation Needs (03/05/2023)   PRAPARE - Administrator, Civil Service (Medical): No    Lack of Transportation (Non-Medical): No  Physical Activity: Sufficiently Active (03/05/2023)   Exercise Vital Sign    Days of Exercise per Week: 7 days    Minutes of Exercise per Session: 60 min  Stress: No Stress Concern Present (03/05/2023)   [employer] of Occupational Health - Occupational Stress Questionnaire    Feeling of Stress : Not at all  Social Connections: Moderately Integrated (03/05/2023)   Social Connection and Isolation Panel [NHANES]    Frequency of Communication with Friends and Family: More than three times a week    Frequency of Social Gatherings with Friends and Family: More than three times a week    Attends Religious Services: Never    Database administrator or Organizations: Yes    Attends Engineer, structural: More than 4 times per year    Marital Status: Married    Tobacco Counseling Counseling given: Not Answered   Clinical Intake:  Pre-visit preparation completed: Yes  Pain : No/denies pain  Nutritional Risks: None Diabetes: No  How often do you need to have someone help you when you read instructions, pamphlets, or other written materials from your doctor or pharmacy?: 1 - Never  Interpreter Needed?: No  Information entered by :: [employer], CMA   Activities of Daily Living    03/05/2023   10:33 AM  In your present state of health, do you have any difficulty performing the following activities:   Hearing? 0  Vision? 0  Difficulty concentrating or making decisions? 0  Walking or climbing stairs? 0  Dressing or bathing? 0  Doing errands, shopping? 0  Preparing Food and eating ? N  Using the Toilet? N  In the past six months, have you accidently leaked urine? N  Do you have problems with loss of bowel control? N  Managing your Medications? N  Managing your Finances? N  Housekeeping or managing your Housekeeping? N  Patient Care Team: Sandford Craze, NP as PCP - General (Internal Medicine)  Indicate any recent Medical Services you may have received from other than [facility] providers in the past year (date may be approximate).     Assessment:   This is a routine wellness examination for Nesbitt.  Hearing/Vision screen No results found.  Dietary issues and exercise activities discussed:     Goals Addressed   None    Depression Screen    03/05/2023   10:41 AM 12/31/2022    8:15 AM 02/27/2022    1:44 PM 12/23/2021    7:04 AM 12/21/2020    7:10 AM 10/09/2020    3:23 PM 05/20/2019    9:09 AM  PHQ 2/9 Scores  PHQ - 2 Score 0 0 0 0 0 0 0  PHQ- 9 Score  0     0    Fall Risk    03/05/2023   10:36 AM 12/31/2022    8:14 AM 02/27/2022    1:44 PM 12/23/2021    7:04 AM 12/21/2020    7:10 AM  Fall Risk   Falls in the past year? 0 0 0 0 0  Number falls in past yr: 0 0 0 0 0  Injury with Fall? 0 0 0 0 0  Risk for fall due to : No Fall Risks No Fall Risks No Fall Risks    Follow up Falls evaluation completed Falls evaluation completed Falls evaluation completed      MEDICARE RISK AT HOME: Medicare Risk at Home Any stairs in or around the home?: Yes If so, are there any without handrails?: No Home free of loose throw rugs in walkways, pet beds, electrical cords, etc?: Yes Adequate lighting in your home to reduce risk of falls?: Yes Life alert?: No Use of a cane, walker or w/c?: No Grab bars in the bathroom?: No Shower chair or bench in shower?: Yes Elevated toilet  seat or a handicapped toilet?: No  TIMED UP AND GO:  Was the test performed?  No    Cognitive Function:        03/05/2023   10:43 AM 02/27/2022    1:49 PM  6CIT Screen  What Year? 0 points 0 points  What month? 0 points 0 points  What time? 0 points 0 points  Count back from 20 0 points 0 points  Months in reverse 0 points 0 points  Repeat phrase 0 points 0 points  Total Score 0 points 0 points    Immunizations Immunization History  Administered Date(s) Administered   Fluad Quad(high Dose 65+) 04/03/2021   Influenza Split 04/25/2022   Influenza,inj,Quad PF,6+ Mos 04/07/2017, 03/16/2018, 04/07/2019   Influenza-Unspecified 04/24/2015, 05/06/2016, 05/15/2020   PFIZER Comirnaty(Gray Top)Covid-19 Tri-Sucrose Vaccine 12/21/2020   PFIZER(Purple Top)SARS-COV-2 Vaccination 09/20/2019, 10/11/2019, 05/15/2020, 04/25/2022   PNEUMOCOCCAL CONJUGATE-20 06/24/2021   Pfizer Covid-19 Vaccine Bivalent Booster 71yrs & up 04/03/2021, 11/29/2021   Pneumococcal Polysaccharide-23 06/20/2020   Respiratory Syncytial Virus Vaccine,Recomb Aduvanted(Arexvy) 05/11/2022   Tdap 04/28/2017   Zoster Recombinant(Shingrix) 03/16/2018, 05/18/2018   Zoster, Live 03/15/2015    TDAP status: Up to date  Flu Vaccine status: Due, Education has been provided regarding the importance of this vaccine. Advised may receive this vaccine at local pharmacy or Health Dept. Aware to provide a copy of the vaccination record if obtained from local pharmacy or Health Dept. Verbalized acceptance and understanding.  Pneumococcal vaccine status: Up to date  Covid-19 vaccine status: Information provided on how to obtain vaccines.  Qualifies for Shingles Vaccine? Yes   Zostavax completed Yes   Shingrix Completed?: Yes  Screening Tests Health Maintenance  Topic Date Due   COVID-19 Vaccine (8 - 2023-24 season) 06/20/2022   Colonoscopy  09/05/2022   Medicare Annual Wellness (AWV)  02/28/2023   INFLUENZA VACCINE   02/05/2023   MAMMOGRAM  07/10/2024   DTaP/Tdap/Td (2 - Td or Tdap) 04/29/2027   Pneumonia Vaccine 50+ Years old  Completed   DEXA SCAN  Completed   Hepatitis C Screening  Completed   Zoster Vaccines- Shingrix  Completed   HPV VACCINES  Aged Out    Health Maintenance  Health Maintenance Due  Topic Date Due   COVID-19 Vaccine (8 - 2023-24 season) 06/20/2022   Colonoscopy  09/05/2022   Medicare Annual Wellness (AWV)  02/28/2023   INFLUENZA VACCINE  02/05/2023    Colorectal cancer screening: Referral to GI placed 12/31/22. Pt aware the office will call re: appt.  Mammogram status: Completed 07/10/22. Repeat every year  Bone Density status: Completed 07/02/21. Results reflect: Bone density results: OSTEOPOROSIS. Repeat every 2 years.  Lung Cancer Screening: (Low Dose CT Chest recommended if Age 51-80 years, 20 pack-year currently smoking OR have quit w/in 15years.) does not qualify.   Additional Screening:  Hepatitis C Screening: does qualify; Completed 06/27/22  Vision Screening: Recommended annual ophthalmology exams for early detection of glaucoma and other disorders of the eye. Is the patient up to date with their annual eye exam?  Yes  Who is the provider or what is the name of the office in which the patient attends annual eye exams? [facility]-Dr. Lillia Abed If pt is not established with a provider, would they like to be referred to a provider to establish care? No .   Dental Screening: Recommended annual dental exams for proper oral hygiene  Diabetic Foot Exam: N/a  Community Resource Referral / Chronic Care Management: CRR required this visit?  No   CCM required this visit?  No     Plan:     I have personally reviewed and noted the following in the patient's chart:   Medical and social history Use of alcohol, tobacco or illicit drugs  Current medications and supplements including opioid prescriptions. Patient is not currently taking opioid prescriptions. Functional  ability and status Nutritional status Physical activity Advanced directives List of other physicians Hospitalizations, surgeries, and ER visits in previous 12 months Vitals Screenings to include cognitive, depression, and falls Referrals and appointments  In addition, I have reviewed and discussed with patient certain preventive protocols, quality metrics, and best practice recommendations. A written personalized care plan for preventive services as well as general preventive health recommendations were provided to patient.     Donne Anon, CMA   03/05/2023   After Visit Summary: (MyChart) Due to this being a telephonic visit, the after visit summary with patients personalized plan was offered to patient via MyChart   Nurse Notes: None

## 2023-03-05 NOTE — Patient Instructions (Signed)
Kara Robinson , Thank you for taking time to come for your Medicare Wellness Visit. I appreciate your ongoing commitment to your health goals. Please review the following plan we discussed and let me know if I can assist you in the future.   These are the goals we discussed:  Goals   None     This is a list of the screening recommended for you and due dates:  Health Maintenance  Topic Date Due   COVID-19 Vaccine (8 - 2023-24 season) 06/20/2022   Colon Cancer Screening  09/05/2022   Flu Shot  02/05/2023   Medicare Annual Wellness Visit  03/04/2024   Mammogram  07/10/2024   DTaP/Tdap/Td vaccine (2 - Td or Tdap) 04/29/2027   Pneumonia Vaccine  Completed   DEXA scan (bone density measurement)  Completed   Hepatitis C Screening  Completed   Zoster (Shingles) Vaccine  Completed   HPV Vaccine  Aged Out     Next appointment: Follow up in one year for your annual wellness visit.   Preventive Care 68 Years and Older, Female Preventive care refers to lifestyle choices and visits with your health care provider that can promote health and wellness. What does preventive care include? A yearly physical exam. This is also called an annual well check. Dental exams once or twice a year. Routine eye exams. Ask your health care provider how often you should have your eyes checked. Personal lifestyle choices, including: Daily care of your teeth and gums. Regular physical activity. Eating a healthy diet. Avoiding tobacco and drug use. Limiting alcohol use. Practicing safe sex. Taking low-dose aspirin every day. Taking vitamin and mineral supplements as recommended by your health care provider. What happens during an annual well check? The services and screenings done by your health care provider during your annual well check will depend on your age, overall health, lifestyle risk factors, and family history of disease. Counseling  Your health care provider may ask you questions about  your: Alcohol use. Tobacco use. Drug use. Emotional well-being. Home and relationship well-being. Sexual activity. Eating habits. History of falls. Memory and ability to understand (cognition). Work and work Astronomer. Reproductive health. Screening  You may have the following tests or measurements: Height, weight, and BMI. Blood pressure. Lipid and cholesterol levels. These may be checked every 5 years, or more frequently if you are over 37 years old. Skin check. Lung cancer screening. You may have this screening every year starting at age 68 if you have a 30-pack-year history of smoking and currently smoke or have quit within the past 15 years. Fecal occult blood test (FOBT) of the stool. You may have this test every year starting at age 68. Flexible sigmoidoscopy or colonoscopy. You may have a sigmoidoscopy every 5 years or a colonoscopy every 10 years starting at age 68. Hepatitis C blood test. Hepatitis B blood test. Sexually transmitted disease (STD) testing. Diabetes screening. This is done by checking your blood sugar (glucose) after you have not eaten for a while (fasting). You may have this done every 1-3 years. Bone density scan. This is done to screen for osteoporosis. You may have this done starting at age 68. Mammogram. This may be done every 1-2 years. Talk to your health care provider about how often you should have regular mammograms. Talk with your health care provider about your test results, treatment options, and if necessary, the need for more tests. Vaccines  Your health care provider may recommend certain vaccines, such as: Influenza  vaccine. This is recommended every year. Tetanus, diphtheria, and acellular pertussis (Tdap, Td) vaccine. You may need a Td booster every 10 years. Zoster vaccine. You may need this after age 25. Pneumococcal 13-valent conjugate (PCV13) vaccine. One dose is recommended after age 68. Pneumococcal polysaccharide (PPSV23) vaccine.  One dose is recommended after age 68. Talk to your health care provider about which screenings and vaccines you need and how often you need them. This information is not intended to replace advice given to you by your health care provider. Make sure you discuss any questions you have with your health care provider. Document Released: 07/20/2015 Document Revised: 03/12/2016 Document Reviewed: 04/24/2015 Elsevier Interactive Patient Education  2017 [employer].  Fall Prevention in the Home Falls can cause injuries. They can happen to people of all ages. There are many things you can do to make your home safe and to help prevent falls. What can I do on the outside of my home? Regularly fix the edges of walkways and driveways and fix any cracks. Remove anything that might make you trip as you walk through a door, such as a raised step or threshold. Trim any bushes or trees on the path to your home. Use bright outdoor lighting. Clear any walking paths of anything that might make someone trip, such as rocks or tools. Regularly check to see if handrails are loose or broken. Make sure that both sides of any steps have handrails. Any raised decks and porches should have guardrails on the edges. Have any leaves, snow, or ice cleared regularly. Use sand or salt on walking paths during winter. Clean up any spills in your garage right away. This includes oil or grease spills. What can I do in the bathroom? Use night lights. Install grab bars by the toilet and in the tub and shower. Do not use towel bars as grab bars. Use non-skid mats or decals in the tub or shower. If you need to sit down in the shower, use a plastic, non-slip stool. Keep the floor dry. Clean up any water that spills on the floor as soon as it happens. Remove soap buildup in the tub or shower regularly. Attach bath mats securely with double-sided non-slip rug tape. Do not have throw rugs and other things on the floor that can make  you trip. What can I do in the bedroom? Use night lights. Make sure that you have a light by your bed that is easy to reach. Do not use any sheets or blankets that are too big for your bed. They should not hang down onto the floor. Have a firm chair that has side arms. You can use this for support while you get dressed. Do not have throw rugs and other things on the floor that can make you trip. What can I do in the kitchen? Clean up any spills right away. Avoid walking on wet floors. Keep items that you use a lot in easy-to-reach places. If you need to reach something above you, use a strong step stool that has a grab bar. Keep electrical cords out of the way. Do not use floor polish or wax that makes floors slippery. If you must use wax, use non-skid floor wax. Do not have throw rugs and other things on the floor that can make you trip. What can I do with my stairs? Do not leave any items on the stairs. Make sure that there are handrails on both sides of the stairs and use them. Fix  handrails that are broken or loose. Make sure that handrails are as long as the stairways. Check any carpeting to make sure that it is firmly attached to the stairs. Fix any carpet that is loose or worn. Avoid having throw rugs at the top or bottom of the stairs. If you do have throw rugs, attach them to the floor with carpet tape. Make sure that you have a light switch at the top of the stairs and the bottom of the stairs. If you do not have them, ask someone to add them for you. What else can I do to help prevent falls? Wear shoes that: Do not have high heels. Have rubber bottoms. Are comfortable and fit you well. Are closed at the toe. Do not wear sandals. If you use a stepladder: Make sure that it is fully opened. Do not climb a closed stepladder. Make sure that both sides of the stepladder are locked into place. Ask someone to hold it for you, if possible. Clearly mark and make sure that you can  see: Any grab bars or handrails. First and last steps. Where the edge of each step is. Use tools that help you move around (mobility aids) if they are needed. These include: Canes. Walkers. Scooters. Crutches. Turn on the lights when you go into a dark area. Replace any light bulbs as soon as they burn out. Set up your furniture so you have a clear path. Avoid moving your furniture around. If any of your floors are uneven, fix them. If there are any pets around you, be aware of where they are. Review your medicines with your doctor. Some medicines can make you feel dizzy. This can increase your chance of falling. Ask your doctor what other things that you can do to help prevent falls. This information is not intended to replace advice given to you by your health care provider. Make sure you discuss any questions you have with your health care provider. Document Released: 04/19/2009 Document Revised: 11/29/2015 Document Reviewed: 07/28/2014 Elsevier Interactive Patient Education  2017 [employer].

## 2023-04-08 DIAGNOSIS — K5900 Constipation, unspecified: Secondary | ICD-10-CM | POA: Diagnosis not present

## 2023-04-08 DIAGNOSIS — H259 Unspecified age-related cataract: Secondary | ICD-10-CM | POA: Diagnosis not present

## 2023-04-08 DIAGNOSIS — G4700 Insomnia, unspecified: Secondary | ICD-10-CM | POA: Diagnosis not present

## 2023-04-08 DIAGNOSIS — G8929 Other chronic pain: Secondary | ICD-10-CM | POA: Diagnosis not present

## 2023-04-08 DIAGNOSIS — E039 Hypothyroidism, unspecified: Secondary | ICD-10-CM | POA: Diagnosis not present

## 2023-04-08 DIAGNOSIS — J309 Allergic rhinitis, unspecified: Secondary | ICD-10-CM | POA: Diagnosis not present

## 2023-04-08 DIAGNOSIS — M8580 Other specified disorders of bone density and structure, unspecified site: Secondary | ICD-10-CM | POA: Diagnosis not present

## 2023-04-17 DIAGNOSIS — D123 Benign neoplasm of transverse colon: Secondary | ICD-10-CM | POA: Diagnosis not present

## 2023-04-17 DIAGNOSIS — D122 Benign neoplasm of ascending colon: Secondary | ICD-10-CM | POA: Diagnosis not present

## 2023-04-17 DIAGNOSIS — Z09 Encounter for follow-up examination after completed treatment for conditions other than malignant neoplasm: Secondary | ICD-10-CM | POA: Diagnosis not present

## 2023-04-17 DIAGNOSIS — D120 Benign neoplasm of cecum: Secondary | ICD-10-CM | POA: Diagnosis not present

## 2023-04-17 DIAGNOSIS — K648 Other hemorrhoids: Secondary | ICD-10-CM | POA: Diagnosis not present

## 2023-04-17 DIAGNOSIS — K5730 Diverticulosis of large intestine without perforation or abscess without bleeding: Secondary | ICD-10-CM | POA: Diagnosis not present

## 2023-04-17 DIAGNOSIS — Z860100 Personal history of colon polyps, unspecified: Secondary | ICD-10-CM | POA: Diagnosis not present

## 2023-04-17 LAB — HM COLONOSCOPY

## 2023-04-21 DIAGNOSIS — D122 Benign neoplasm of ascending colon: Secondary | ICD-10-CM | POA: Diagnosis not present

## 2023-04-21 DIAGNOSIS — D120 Benign neoplasm of cecum: Secondary | ICD-10-CM | POA: Diagnosis not present

## 2023-04-21 DIAGNOSIS — D123 Benign neoplasm of transverse colon: Secondary | ICD-10-CM | POA: Diagnosis not present

## 2023-04-30 ENCOUNTER — Encounter: Payer: Self-pay | Admitting: Gastroenterology

## 2023-05-01 ENCOUNTER — Telehealth: Payer: Self-pay | Admitting: Family

## 2023-05-01 NOTE — Telephone Encounter (Signed)
Please request copy of colo from [facility].

## 2023-05-01 NOTE — Telephone Encounter (Signed)
Electronic request sent 

## 2023-05-29 ENCOUNTER — Other Ambulatory Visit: Payer: Self-pay | Admitting: Family

## 2023-05-31 ENCOUNTER — Other Ambulatory Visit: Payer: Self-pay | Admitting: Family

## 2023-07-03 ENCOUNTER — Ambulatory Visit: Payer: PPO | Admitting: Family

## 2023-07-14 ENCOUNTER — Ambulatory Visit: Payer: PPO | Admitting: Family

## 2023-07-14 ENCOUNTER — Ambulatory Visit (INDEPENDENT_AMBULATORY_CARE_PROVIDER_SITE_OTHER): Payer: PPO | Admitting: Family

## 2023-07-14 VITALS — BP 133/60 | HR 65 | Temp 97.5°F | Resp 16 | Ht 65.0 in | Wt 143.0 lb

## 2023-07-14 DIAGNOSIS — E039 Hypothyroidism, unspecified: Secondary | ICD-10-CM

## 2023-07-14 DIAGNOSIS — J302 Other seasonal allergic rhinitis: Secondary | ICD-10-CM

## 2023-07-14 DIAGNOSIS — E785 Hyperlipidemia, unspecified: Secondary | ICD-10-CM

## 2023-07-14 DIAGNOSIS — M1990 Unspecified osteoarthritis, unspecified site: Secondary | ICD-10-CM | POA: Diagnosis not present

## 2023-07-14 DIAGNOSIS — Z780 Asymptomatic menopausal state: Secondary | ICD-10-CM

## 2023-07-14 DIAGNOSIS — Z1231 Encounter for screening mammogram for malignant neoplasm of breast: Secondary | ICD-10-CM

## 2023-07-14 LAB — LIPID PANEL
Cholesterol: 255 mg/dL — ABNORMAL HIGH (ref 0–200)
HDL: 96.3 mg/dL (ref >39.00)
LDL Cholesterol: 144 mg/dL — ABNORMAL HIGH (ref 0–99)
NonHDL: 158.8
Total CHOL/HDL Ratio: 3
Triglycerides: 73 mg/dL (ref 0.0–149.0)
VLDL: 14.6 mg/dL (ref 0.0–40.0)

## 2023-07-14 LAB — COMPREHENSIVE METABOLIC PANEL
ALT: 9 U/L (ref 0–35)
AST: 15 U/L (ref 0–37)
Albumin: 4.6 g/dL (ref 3.5–5.2)
Alkaline Phosphatase: 51 U/L (ref 39–117)
BUN: 10 mg/dL (ref 6–23)
CO2: 27 meq/L (ref 19–32)
Calcium: 9.1 mg/dL (ref 8.4–10.5)
Chloride: 103 meq/L (ref 96–112)
Creatinine, Ser: 0.63 mg/dL (ref 0.40–1.20)
GFR: 90.89 mL/min (ref >60.00)
Glucose, Bld: 82 mg/dL (ref 70–99)
Potassium: 4.1 meq/L (ref 3.5–5.1)
Sodium: 139 meq/L (ref 135–145)
Total Bilirubin: 0.6 mg/dL (ref 0.2–1.2)
Total Protein: 6.6 g/dL (ref 6.0–8.3)

## 2023-07-14 LAB — TSH: TSH: 0.56 u[IU]/mL (ref 0.35–5.50)

## 2023-07-14 NOTE — Assessment & Plan Note (Signed)
 She will let me know if her shoulder pain worsens and if she wants to see sports medicine. Continue current self care measures for now.

## 2023-07-14 NOTE — Assessment & Plan Note (Signed)
 Continue healthy diet. Update lipid panel.

## 2023-07-14 NOTE — Assessment & Plan Note (Addendum)
 Stable on daily allegra flonase, no longer needing singulair.  Monitor.

## 2023-07-14 NOTE — Progress Notes (Signed)
 Subjective:     Patient ID: Kara Robinson, female    DOB: [birth year], 68 y.o.   MRN: [id number]  Chief Complaint  Patient presents with   Hypothyroidism    Here for follow up    HPI  Discussed the use of AI scribe software for clinical note transcription with the patient, who gave verbal consent to proceed.  History of Present Illness      Patient is a 68 yr old female who presents today for follow up.  She reports feeling well on her current dose of synthroid .  She notes some bilateral shoulder pain but this is being managed by some exercises she is doing at home as well as a new pillow that she bought that helps to support her C spine. She rarely uses meloxicam  but does find it helpful when she uses it.     Lab Results  Component Value Date   TSH 0.87 12/31/2022   She reports that her allergies are stable.  She discontinued singulair  without issue. She continues allegra and flonase  daily.    Health Maintenance Due  Topic Date Due   COVID-19 Vaccine (8 - 2024-25 season) 03/08/2023    Past Medical History:  Diagnosis Date   Allergy    Anemia    Arthritis    Hyperlipidemia    Family Hx   Thyroid  disease     Past Surgical History:  Procedure Laterality Date   APPENDECTOMY     BREAST EXCISIONAL BIOPSY Left    x 2   BREAST LUMPECTOMY WITH AXILLARY LYMPH NODE BIOPSY     HERNIA REPAIR     KNEE ARTHROSCOPY Left     Family History  Problem Relation Age of Onset   Heart disease Mother        MI at age 54, smoker   Hypertension Mother    Kidney disease Father    Kidney cancer Father    Heart disease Sister        heart valve replacement--2016   Diabetes type II Sister    Other Sister        antisynthetase Syndrome   Heart attack Sister    Melanoma Sister    Leukemia Maternal Grandmother    Liver cancer Maternal Grandfather     Social History   Socioeconomic History   Marital status: Married    Spouse name: Not on file   Number of children: Not  on file   Years of education: Not on file   Highest education level: Not on file  Occupational History   Not on file  Tobacco Use   Smoking status: Never   Smokeless tobacco: Never  Vaping Use   Vaping status: Never Used  Substance and Sexual Activity   Alcohol use: Yes    Alcohol/week: 1.0 - 2.0 standard drink of alcohol    Types: 1 - 2 Glasses of wine per week   Drug use: Never   Sexual activity: Yes  Other Topics Concern   Not on file  Social History Narrative   She is unemployed,  Used to work as a production designer, theatre/television/film at a bank   3 daughters, 7 grandchildren 1 great grandson (all local) moved from [place]  20 years ago.     Enjoys traveling, reading cooking, spending time with family   Married    Social Drivers of Corporate Investment Banker Strain: Low Risk  (03/05/2023)   Overall Financial Resource Strain (CARDIA)    Difficulty of Paying Living  Expenses: Not hard at all  Food Insecurity: No Food Insecurity (03/05/2023)   Hunger Vital Sign    Worried About Running Out of Food in the Last Year: Never true    Ran Out of Food in the Last Year: Never true  Transportation Needs: No Transportation Needs (03/05/2023)   PRAPARE - Administrator, Civil Service (Medical): No    Lack of Transportation (Non-Medical): No  Physical Activity: Sufficiently Active (03/05/2023)   Exercise Vital Sign    Days of Exercise per Week: 7 days    Minutes of Exercise per Session: 60 min  Stress: No Stress Concern Present (03/05/2023)   [employer] of Occupational Health - Occupational Stress Questionnaire    Feeling of Stress : Not at all  Social Connections: Moderately Integrated (03/05/2023)   Social Connection and Isolation Panel [NHANES]    Frequency of Communication with Friends and Family: More than three times a week    Frequency of Social Gatherings with Friends and Family: More than three times a week    Attends Religious Services: Never    Database Administrator or Organizations:  Yes    Attends Engineer, Structural: More than 4 times per year    Marital Status: Married  Catering Manager Violence: Not At Risk (03/05/2023)   Humiliation, Afraid, Rape, and Kick questionnaire    Fear of Current or Ex-Partner: No    Emotionally Abused: No    Physically Abused: No    Sexually Abused: No    Outpatient Medications Prior to Visit  Medication Sig Dispense Refill   alendronate  (FOSAMAX ) 70 MG tablet Take 1 tablet (70 mg total) by mouth every 7 (seven) days. Take with a full glass of water on an empty stomach. 12 tablet 4   Biotin 1 MG CAPS Take by mouth.     cholecalciferol (VITAMIN D3) 25 MCG (1000 UT) tablet Take 1,000 Units by mouth daily.     fexofenadine (ALLEGRA) 180 MG tablet 1 tablet     fluticasone  (FLONASE ) 50 MCG/ACT nasal spray Place 2 sprays into both nostrils daily. 16 g 6   levothyroxine  (SYNTHROID ) 25 MCG tablet TAKE 0.5 TABLETS (12.5 MCG TOTAL) BY MOUTH DAILY BEFORE BREAKFAST. 45 tablet 0   meloxicam  (MOBIC ) 7.5 MG tablet TAKE 1 TABLET BY MOUTH EVERY DAY 14 tablet 0   traZODone  (DESYREL ) 50 MG tablet TAKE 1/2 TO 1 TABLET(25 TO 50 MG) BY MOUTH AT BEDTIME AS NEEDED FOR SLEEP 90 tablet 1   montelukast  (SINGULAIR ) 10 MG tablet TAKE 1 TABLET (10 MG) BY MOUTH EVERY DAY AT BEDTIME 90 tablet 1   No facility-administered medications prior to visit.    Allergies  Allergen Reactions   Oxycodone Itching and Rash    Scratched herself until she bled   Penicillins Rash    Augmentin     ROS See HPI    Objective:    Physical Exam Constitutional:      Appearance: She is well-developed.  Neck:     Thyroid : No thyroid  mass or thyromegaly.  Cardiovascular:     Rate and Rhythm: Normal rate and regular rhythm.     Heart sounds: Normal heart sounds. No murmur heard. Pulmonary:     Effort: Pulmonary effort is normal. No respiratory distress.     Breath sounds: Normal breath sounds. No wheezing.  Musculoskeletal:     Cervical back: Neck supple.   Psychiatric:        Behavior: Behavior normal.  Thought Content: Thought content normal.        Judgment: Judgment normal.      BP 133/60 (BP Location: Right Arm, Patient Position: Sitting, Cuff Size: Small)   Pulse 65   Temp (!) 97.5 F (36.4 C) (Oral)   Resp 16   Ht 5' 5 (1.651 m)   Wt 143 lb (64.9 kg)   SpO2 100%   BMI 23.80 kg/m  Wt Readings from Last 3 Encounters:  07/14/23 143 lb (64.9 kg)  01/19/23 142 lb (64.4 kg)  12/31/22 138 lb (62.6 kg)       Assessment & Plan:   Problem List Items Addressed This Visit       Unprioritized   Seasonal allergies - Primary   Stable on daily allegra flonase , no longer needing singulair .  Monitor.       Osteoarthritis   She will let me know if her shoulder pain worsens and if she wants to see sports medicine. Continue current self care measures for now.       Hypothyroid   Clinically stable on synthroid . Update tsh.       Relevant Orders   TSH   Hyperlipidemia   Continue healthy diet. Update lipid panel.       Relevant Orders   Comp Met (CMET)   Lipid panel   Other Visit Diagnoses       Breast cancer screening by mammogram       Relevant Orders   MM 3D SCREENING MAMMOGRAM BILATERAL BREAST     Postmenopausal estrogen deficiency       Relevant Orders   DG Bone Density       I have discontinued Kara A. Vivas's montelukast . I am also having her maintain her fluticasone , fexofenadine, Biotin, cholecalciferol, alendronate , meloxicam , levothyroxine , and traZODone .  No orders of the defined types were placed in this encounter.

## 2023-07-14 NOTE — Assessment & Plan Note (Signed)
 Clinically stable on synthroid.  Update tsh.

## 2023-07-17 ENCOUNTER — Other Ambulatory Visit: Payer: Self-pay | Admitting: Family

## 2023-07-17 MED ORDER — MELOXICAM 7.5 MG PO TABS: 7.5000 mg | ORAL_TABLET | Freq: Every day | ORAL | 0 refills | Status: DC

## 2023-07-21 ENCOUNTER — Encounter (HOSPITAL_BASED_OUTPATIENT_CLINIC_OR_DEPARTMENT_OTHER): Payer: Self-pay

## 2023-07-21 ENCOUNTER — Ambulatory Visit (HOSPITAL_BASED_OUTPATIENT_CLINIC_OR_DEPARTMENT_OTHER)
Admission: RE | Admit: 2023-07-21 | Discharge: 2023-07-21 | Disposition: A | Discharge status: Home / Self Care | Payer: PPO | Source: Ambulatory Visit | Attending: Family | Admitting: Family

## 2023-07-21 ENCOUNTER — Encounter: Payer: Self-pay | Admitting: Family

## 2023-07-21 DIAGNOSIS — M810 Age-related osteoporosis without current pathological fracture: Secondary | ICD-10-CM | POA: Diagnosis not present

## 2023-07-21 DIAGNOSIS — Z780 Asymptomatic menopausal state: Secondary | ICD-10-CM | POA: Insufficient documentation

## 2023-07-21 DIAGNOSIS — Z1231 Encounter for screening mammogram for malignant neoplasm of breast: Secondary | ICD-10-CM | POA: Diagnosis not present

## 2023-07-21 HISTORY — DX: Age-related osteoporosis without current pathological fracture: M81.0

## 2023-08-31 ENCOUNTER — Ambulatory Visit
Admission: RE | Admit: 2023-08-31 | Discharge: 2023-08-31 | Disposition: A | Discharge status: Home / Self Care | Payer: PPO | Source: Ambulatory Visit | Attending: Family Medicine | Admitting: Family Medicine

## 2023-08-31 VITALS — BP 157/77 | HR 67 | Temp 97.7°F | Resp 16

## 2023-08-31 DIAGNOSIS — J069 Acute upper respiratory infection, unspecified: Secondary | ICD-10-CM | POA: Diagnosis not present

## 2023-08-31 DIAGNOSIS — R070 Pain in throat: Secondary | ICD-10-CM | POA: Insufficient documentation

## 2023-08-31 LAB — POC COVID19/FLU A&B COMBO
Covid Antigen, POC: NEGATIVE
Influenza A Antigen, POC: NEGATIVE
Influenza B Antigen, POC: NEGATIVE

## 2023-08-31 LAB — POCT RAPID STREP A (OFFICE): Rapid Strep A Screen: NEGATIVE

## 2023-08-31 MED ORDER — CETIRIZINE HCL 10 MG PO TABS: 10.0000 mg | ORAL_TABLET | Freq: Every day | ORAL | 0 refills | Status: AC

## 2023-08-31 MED ORDER — PROMETHAZINE-DM 6.25-15 MG/5ML PO SYRP: 5.0000 mL | ORAL_SOLUTION | Freq: Every evening | ORAL | 0 refills | Status: DC | PRN

## 2023-08-31 MED ORDER — PSEUDOEPHEDRINE HCL 60 MG PO TABS: 60.0000 mg | ORAL_TABLET | Freq: Three times a day (TID) | ORAL | 0 refills | Status: DC | PRN

## 2023-08-31 MED ORDER — IBUPROFEN 600 MG PO TABS: 600.0000 mg | ORAL_TABLET | Freq: Four times a day (QID) | ORAL | 0 refills | Status: AC | PRN

## 2023-08-31 NOTE — Discharge Instructions (Signed)
 We will manage this as a viral upper respiratory illness. For sore throat or cough try using a honey-based tea. Use 3 teaspoons of honey with juice squeezed from half lemon. Place shaved pieces of ginger into 1/2-1 cup of water and warm over stove top. Then mix the ingredients and repeat every 4 hours as needed. Please take ibuprofen 600mg  every 6 hours with food alternating with OR taken together with Tylenol 500mg -650mg  every 6 hours for throat pain, fevers, aches and pains. Hydrate very well with at least 2 liters of water. Eat light meals such as soups (chicken and noodles, vegetable, chicken and wild rice).  Do not eat foods that you are allergic to.  Taking an antihistamine like Zyrtec (10mg  daily) can help against postnasal drainage, sinus congestion which can cause sinus pain, sinus headaches, throat pain, painful swallowing, coughing.  You can take this together with pseudoephedrine (Sudafed) at a dose of 60mg  3 times a day or twice daily as needed for the same kind of nasal drip, congestion.  Use cough syrup as at bedtime.

## 2023-08-31 NOTE — ED Provider Notes (Signed)
 [facility]  Note:  This document was prepared using Conservation officer, historic buildings and may include unintentional dictation errors.  MRN: [id number] DOB: 03-03-1956  Subjective:   Kara Robinson is a 68 y.o. female presenting for 3 day history of sinus congestion, sinus drainage, throat pain, bilateral ear fullness/pain. Throat pain and ears are the most bothersome. No chest pain, shob, wheezing, fever, body aches. No asthma. No smoking of any kind including cigarettes, cigars, vaping, marijuana use.    No current facility-administered medications for this encounter.  Current Outpatient Medications:    alendronate (FOSAMAX) 70 MG tablet, Take 1 tablet (70 mg total) by mouth every 7 (seven) days. Take with a full glass of water on an empty stomach., Disp: 12 tablet, Rfl: 4   Biotin 1 MG CAPS, Take by mouth., Disp: , Rfl:    cholecalciferol (VITAMIN D3) 25 MCG (1000 UT) tablet, Take 1,000 Units by mouth daily., Disp: , Rfl:    fexofenadine (ALLEGRA) 180 MG tablet, 1 tablet, Disp: , Rfl:    fluticasone (FLONASE) 50 MCG/ACT nasal spray, Place 2 sprays into both nostrils daily., Disp: 16 g, Rfl: 6   levothyroxine (SYNTHROID) 25 MCG tablet, TAKE 0.5 TABLETS (12.5 MCG TOTAL) BY MOUTH DAILY BEFORE BREAKFAST., Disp: 45 tablet, Rfl: 0   meloxicam (MOBIC) 7.5 MG tablet, Take 1 tablet (7.5 mg total) by mouth daily., Disp: 14 tablet, Rfl: 0   traZODone (DESYREL) 50 MG tablet, TAKE 1/2 TO 1 TABLET(25 TO 50 MG) BY MOUTH AT BEDTIME AS NEEDED FOR SLEEP, Disp: 90 tablet, Rfl: 1   Allergies  Allergen Reactions   Oxycodone Itching and Rash    Scratched herself until she bled   Augmentin [Amoxicillin-Pot Clavulanate] Rash    Past Medical History:  Diagnosis Date   Allergy    Anemia    Arthritis    Hyperlipidemia    Family Hx   Osteoporosis    Thyroid disease      Past Surgical History:  Procedure Laterality Date   APPENDECTOMY     BREAST EXCISIONAL BIOPSY Left     x 2   BREAST LUMPECTOMY WITH AXILLARY LYMPH NODE BIOPSY     HERNIA REPAIR     KNEE ARTHROSCOPY Left     Family History  Problem Relation Age of Onset   Heart disease Mother        MI at age 91, smoker   Hypertension Mother    Kidney disease Father    Kidney cancer Father    Heart disease Sister        heart valve replacement--2016   Diabetes type II Sister    Other Sister        antisynthetase Syndrome   Heart attack Sister    Melanoma Sister    Leukemia Maternal Grandmother    Liver cancer Maternal Grandfather     Social History   Tobacco Use   Smoking status: Never   Smokeless tobacco: Never  Vaping Use   Vaping status: Never Used  Substance Use Topics   Alcohol use: Not Currently    Comment: occ   Drug use: Never    ROS   Objective:   Vitals: BP (!) 157/77 (BP Location: Right Arm)   Pulse 67   Temp 97.7 F (36.5 C) (Oral)   Resp 16   SpO2 97%   Physical Exam Constitutional:      General: She is not in acute distress.    Appearance: Normal appearance.  She is well-developed and normal weight. She is not ill-appearing, toxic-appearing or diaphoretic.  HENT:     Head: Normocephalic and atraumatic.     Right Ear: Tympanic membrane, ear canal and external ear normal. No drainage or tenderness. No middle ear effusion. There is no impacted cerumen. Tympanic membrane is not injected, perforated, erythematous or bulging.     Left Ear: Tympanic membrane, ear canal and external ear normal. No drainage or tenderness.  No middle ear effusion. There is no impacted cerumen. Tympanic membrane is not injected, perforated, erythematous or bulging.     Nose: Nose normal. No congestion or rhinorrhea.     Mouth/Throat:     Mouth: Mucous membranes are moist. No oral lesions.     Pharynx: No pharyngeal swelling, oropharyngeal exudate, posterior oropharyngeal erythema or uvula swelling.     Tonsils: No tonsillar exudate or tonsillar abscesses. 0 on the right. 0 on the left.   Eyes:     General: No scleral icterus.       Right eye: No discharge.        Left eye: No discharge.     Extraocular Movements: Extraocular movements intact.     Right eye: Normal extraocular motion.     Left eye: Normal extraocular motion.     Conjunctiva/sclera: Conjunctivae normal.  Cardiovascular:     Rate and Rhythm: Normal rate and regular rhythm.     Heart sounds: Normal heart sounds. No murmur heard.    No friction rub. No gallop.  Pulmonary:     Effort: Pulmonary effort is normal. No respiratory distress.     Breath sounds: No stridor. No wheezing, rhonchi or rales.  Chest:     Chest wall: No tenderness.  Musculoskeletal:     Cervical back: Normal range of motion and neck supple.  Lymphadenopathy:     Cervical: No cervical adenopathy.  Skin:    General: Skin is warm and dry.  Neurological:     General: No focal deficit present.     Mental Status: She is alert and oriented to person, place, and time.  Psychiatric:        Mood and Affect: Mood normal.        Behavior: Behavior normal.    Results for orders placed or performed during the hospital encounter of 08/31/23 (from the past 24 hours)  POCT rapid strep A     Status: Normal   Collection Time: 08/31/23 11:38 AM  Result Value Ref Range   Rapid Strep A Screen Negative   POC Covid + Flu A/B Antigen     Status: Normal   Collection Time: 08/31/23 11:45 AM  Result Value Ref Range   Influenza A Antigen, POC Negative    Influenza B Antigen, POC Negative    Covid Antigen, POC Negative     Assessment and Plan :   PDMP not reviewed this encounter.  1. Viral upper respiratory illness   2. Throat pain    Deferred imaging given clear cardiopulmonary exam, hemodynamically stable vital signs. Suspect viral URI, viral syndrome. Physical exam findings reassuring and vital signs stable for discharge. Advised supportive care, offered symptomatic relief. Counseled patient on potential for adverse effects with medications  prescribed/recommended today, ER and return-to-clinic precautions discussed, patient verbalized understanding.     Wallis Bamberg, [place] 08/31/23 1207

## 2023-08-31 NOTE — ED Triage Notes (Signed)
 Pt c/o cough, sore throat, head congestion with post nasal drip and runny nose-sx started 3 days ago-NAD-steady gait

## 2023-09-03 LAB — CULTURE, GROUP A STREP (THRC)

## 2023-09-04 DIAGNOSIS — J0140 Acute pansinusitis, unspecified: Secondary | ICD-10-CM | POA: Diagnosis not present

## 2023-09-17 ENCOUNTER — Other Ambulatory Visit: Payer: Self-pay | Admitting: Family

## 2023-09-23 DIAGNOSIS — L821 Other seborrheic keratosis: Secondary | ICD-10-CM | POA: Diagnosis not present

## 2023-09-23 DIAGNOSIS — D2261 Melanocytic nevi of right upper limb, including shoulder: Secondary | ICD-10-CM | POA: Diagnosis not present

## 2023-09-23 DIAGNOSIS — Z129 Encounter for screening for malignant neoplasm, site unspecified: Secondary | ICD-10-CM | POA: Diagnosis not present

## 2023-09-23 DIAGNOSIS — X32XXXS Exposure to sunlight, sequela: Secondary | ICD-10-CM | POA: Diagnosis not present

## 2023-09-23 DIAGNOSIS — L814 Other melanin hyperpigmentation: Secondary | ICD-10-CM | POA: Diagnosis not present

## 2023-09-23 DIAGNOSIS — D225 Melanocytic nevi of trunk: Secondary | ICD-10-CM | POA: Diagnosis not present

## 2023-09-23 DIAGNOSIS — D2262 Melanocytic nevi of left upper limb, including shoulder: Secondary | ICD-10-CM | POA: Diagnosis not present

## 2023-11-22 ENCOUNTER — Other Ambulatory Visit: Payer: Self-pay | Admitting: Family

## 2024-01-12 ENCOUNTER — Encounter: Payer: Self-pay | Admitting: Family

## 2024-01-12 ENCOUNTER — Telehealth: Payer: Self-pay | Admitting: Family

## 2024-01-12 ENCOUNTER — Ambulatory Visit (INDEPENDENT_AMBULATORY_CARE_PROVIDER_SITE_OTHER): Payer: PPO | Admitting: Family

## 2024-01-12 VITALS — BP 122/80 | HR 75 | Temp 97.9°F | Wt 145.0 lb

## 2024-01-12 DIAGNOSIS — E559 Vitamin D deficiency, unspecified: Secondary | ICD-10-CM

## 2024-01-12 DIAGNOSIS — Z0000 Encounter for general adult medical examination without abnormal findings: Secondary | ICD-10-CM

## 2024-01-12 DIAGNOSIS — M810 Age-related osteoporosis without current pathological fracture: Secondary | ICD-10-CM

## 2024-01-12 DIAGNOSIS — E785 Hyperlipidemia, unspecified: Secondary | ICD-10-CM | POA: Diagnosis not present

## 2024-01-12 DIAGNOSIS — Z0184 Encounter for antibody response examination: Secondary | ICD-10-CM

## 2024-01-12 LAB — BASIC METABOLIC PANEL WITH GFR
BUN: 14 mg/dL (ref 6–23)
CO2: 26 meq/L (ref 19–32)
Calcium: 9.4 mg/dL (ref 8.4–10.5)
Chloride: 103 meq/L (ref 96–112)
Creatinine, Ser: 0.66 mg/dL (ref 0.40–1.20)
GFR: 89.57 mL/min (ref >60.00)
Glucose, Bld: 94 mg/dL (ref 70–99)
Potassium: 4.3 meq/L (ref 3.5–5.1)
Sodium: 139 meq/L (ref 135–145)

## 2024-01-12 LAB — VITAMIN D 25 HYDROXY (VIT D DEFICIENCY, FRACTURES): VITD: 61.05 ng/mL (ref 30.00–100.00)

## 2024-01-12 MED ORDER — ROSUVASTATIN CALCIUM 5 MG PO TABS: 5.0000 mg | ORAL_TABLET | Freq: Every day | ORAL | 1 refills | Status: DC

## 2024-01-12 NOTE — Telephone Encounter (Signed)
 Please initiate Prolia approval.

## 2024-01-12 NOTE — Assessment & Plan Note (Signed)
  Non-compliance with Fosamax . Significant fracture risk (especially given low T score of radius). Menopause-related estrogen decrease. Fosamax  risks outweighed by hip fracture risk. Prolia  as alternative. - Consider starting Fosamax . - Discuss Prolia  injection every six months if Fosamax  not preferred. - Check vitamin D  levels. - Run Prolia  through insurance for copay feasibility.

## 2024-01-12 NOTE — Progress Notes (Signed)
 Subjective:     Patient ID: Kara Robinson, female    DOB: 1955/10/04, 68 y.o.   MRN: [id number]  Chief Complaint  Patient presents with   Medical Management of Chronic Issues    HPI  Discussed the use of AI scribe software for clinical note transcription with the patient, who gave verbal consent to proceed.  History of Present Illness   Kara Robinson is a 68 year old female with hyperlipidemia and osteoporosis who presents for a routine follow-up on her medications.  She is concerned about her cholesterol level, previously recorded at 255 mg/dL, due to a family history of heart attacks. Her mother and sister died from heart attacks at ages 12 and 25. She experienced muscle pain with Lipitor in the past.  She has been prescribed Fosamax  for osteoporosis but has not taken it due to concerns about side effects. She maintains an active lifestyle with daily walking and weight-bearing exercises. She takes calcium  and vitamin D  supplements and is interested in checking her vitamin D  levels.  She plans to travel to [place]  in September and is concerned about her measles immunity, as she does not recall having measles or receiving a vaccination. She is interested in a titer test to check her immunity status.  She is considering a gene screening for cancer mutations and is interested in a cardiac CT for coronary artery disease screening, similar to a test her husband underwent.    Health Maintenance Due  Topic Date Due   COVID-19 Vaccine (8 - 2024-25 season) 03/08/2023   Medicare Annual Wellness (AWV)  03/04/2024    Past Medical History:  Diagnosis Date   Allergy    Anemia    Arthritis    Hyperlipidemia    Family Hx   Osteoporosis    Thyroid  disease     Past Surgical History:  Procedure Laterality Date   APPENDECTOMY     BREAST EXCISIONAL BIOPSY Left    x 2   BREAST LUMPECTOMY WITH AXILLARY LYMPH NODE BIOPSY     HERNIA REPAIR     KNEE ARTHROSCOPY Left      Family History  Problem Relation Age of Onset   Heart disease Mother        MI at age 64, smoker   Hypertension Mother    Kidney disease Father    Kidney cancer Father    Heart disease Sister        heart valve replacement--2016   Diabetes type II Sister    Other Sister        antisynthetase Syndrome   Heart attack Sister    Melanoma Sister    Leukemia Maternal Grandmother    Liver cancer Maternal Grandfather     Social History   Socioeconomic History   Marital status: Married    Spouse name: Not on file   Number of children: Not on file   Years of education: Not on file   Highest education level: Associate degree: occupational, Scientist, product/process development, or vocational program  Occupational History   Not on file  Tobacco Use   Smoking status: Never   Smokeless tobacco: Never  Vaping Use   Vaping status: Never Used  Substance and Sexual Activity   Alcohol use: Not Currently    Comment: occ   Drug use: Never   Sexual activity: Yes  Other Topics Concern   Not on file  Social History Narrative   She is unemployed,  Used to work as a Production designer, theatre/television/film at a  bank   3 daughters, 7 grandchildren 1 great grandson (all local) moved from [place]  20 years ago.     Enjoys traveling, reading cooking, spending time with family   Married    Social Drivers of Corporate investment banker Strain: Low Risk  (01/11/2024)   Overall Financial Resource Strain (CARDIA)    Difficulty of Paying Living Expenses: Not hard at all  Food Insecurity: No Food Insecurity (01/11/2024)   Hunger Vital Sign    Worried About Running Out of Food in the Last Year: Never true    Ran Out of Food in the Last Year: Never true  Transportation Needs: No Transportation Needs (01/11/2024)   PRAPARE - Administrator, Civil Service (Medical): No    Lack of Transportation (Non-Medical): No  Physical Activity: Sufficiently Active (01/11/2024)   Exercise Vital Sign    Days of Exercise per Week: 7 days    Minutes of Exercise per  Session: 50 min  Stress: Stress Concern Present (01/11/2024)   [employer] of Occupational Health - Occupational Stress Questionnaire    Feeling of Stress: To some extent  Social Connections: Socially Integrated (01/11/2024)   Social Connection and Isolation Panel    Frequency of Communication with Friends and Family: More than three times a week    Frequency of Social Gatherings with Friends and Family: More than three times a week    Attends Religious Services: More than 4 times per year    Active Member of [employer] or Organizations: Yes    Attends Engineer, structural: More than 4 times per year    Marital Status: Married  Catering manager Violence: Not At Risk (03/05/2023)   Humiliation, Afraid, Rape, and Kick questionnaire    Fear of Current or Ex-Partner: No    Emotionally Abused: No    Physically Abused: No    Sexually Abused: No    Outpatient Medications Prior to Visit  Medication Sig Dispense Refill   cetirizine  (ZYRTEC  ALLERGY) 10 MG tablet Take 1 tablet (10 mg total) by mouth daily. 30 tablet 0   cholecalciferol (VITAMIN D3) 25 MCG (1000 UT) tablet Take 1,000 Units by mouth daily.     fexofenadine (ALLEGRA) 180 MG tablet 1 tablet     fluticasone  (FLONASE ) 50 MCG/ACT nasal spray Place 2 sprays into both nostrils daily. 16 g 6   ibuprofen  (ADVIL ) 600 MG tablet Take 1 tablet (600 mg total) by mouth every 6 (six) hours as needed. 30 tablet 0   levothyroxine  (SYNTHROID ) 25 MCG tablet TAKE 0.5 TABLETS (12.5 MCG TOTAL) BY MOUTH DAILY BEFORE BREAKFAST. 45 tablet 1   traZODone  (DESYREL ) 50 MG tablet TAKE 1/2 TO 1 TABLET(25 TO 50 MG) BY MOUTH AT BEDTIME AS NEEDED FOR SLEEP 90 tablet 1   alendronate  (FOSAMAX ) 70 MG tablet Take 1 tablet (70 mg total) by mouth every 7 (seven) days. Take with a full glass of water on an empty stomach. 12 tablet 4   meloxicam  (MOBIC ) 7.5 MG tablet Take 1 tablet (7.5 mg total) by mouth daily. 14 tablet 0   Biotin 1 MG CAPS Take by mouth.      promethazine -dextromethorphan (PROMETHAZINE -DM) 6.25-15 MG/5ML syrup Take 5 mLs by mouth at bedtime as needed for cough. 100 mL 0   pseudoephedrine  (SUDAFED) 60 MG tablet Take 1 tablet (60 mg total) by mouth every 8 (eight) hours as needed for congestion. (Patient not taking: Reported on 01/12/2024) 30 tablet 0   No facility-administered medications prior to  visit.    Allergies  Allergen Reactions   Oxycodone Itching and Rash    Scratched herself until she bled   Augmentin  [Amoxicillin -Pot Clavulanate] Rash    ROS See HPI    Objective:    Physical Exam Constitutional:      General: She is not in acute distress.    Appearance: Normal appearance. She is well-developed.  HENT:     Head: Normocephalic and atraumatic.     Right Ear: External ear normal.     Left Ear: External ear normal.  Eyes:     General: No scleral icterus. Neck:     Thyroid : No thyromegaly.  Cardiovascular:     Rate and Rhythm: Normal rate and regular rhythm.     Heart sounds: Normal heart sounds. No murmur heard. Pulmonary:     Effort: Pulmonary effort is normal. No respiratory distress.     Breath sounds: Normal breath sounds. No wheezing.  Musculoskeletal:     Cervical back: Neck supple.  Skin:    General: Skin is warm and dry.  Neurological:     Mental Status: She is alert and oriented to person, place, and time.  Psychiatric:        Mood and Affect: Mood normal.        Behavior: Behavior normal.        Thought Content: Thought content normal.        Judgment: Judgment normal.      BP 122/80   Pulse 75   Temp 97.9 F (36.6 C)   Wt 145 lb (65.8 kg)   SpO2 97%   BMI 24.13 kg/m  Wt Readings from Last 3 Encounters:  01/12/24 145 lb (65.8 kg)  07/14/23 143 lb (64.9 kg)  01/19/23 142 lb (64.4 kg)       Assessment & Plan:   Problem List Items Addressed This Visit       Unprioritized   Preventative health care    Concern about measles immunity for travel.  - Order measles titer. -  Pt requests coronary artery disease screening with cardiac CT. - Discussed optional gene screening for cancer mutations through [facility].      Relevant Orders   CT CARDIAC SCORING (SELF PAY ONLY)   Osteoporosis    Non-compliance with Fosamax . Significant fracture risk (especially given low T score of radius). Menopause-related estrogen decrease. Fosamax  risks outweighed by hip fracture risk. Prolia  as alternative. - Consider starting Fosamax . - Discuss Prolia  injection every six months if Fosamax  not preferred. - Check vitamin D  levels. - Run Prolia  through insurance for copay feasibility.      Hyperlipidemia   CV risk calc estimates 10 yr risk as 7.3%.  Encouraged pt to trial crestor  for CV risk reduction.      Relevant Medications   rosuvastatin  (CRESTOR ) 5 MG tablet   Other Relevant Orders   Basic Metabolic Panel (BMET)   Other Visit Diagnoses       Vitamin D  deficiency    -  Primary   Relevant Orders   Vitamin D  (25 hydroxy)     Immunity status testing       Relevant Orders   Measles/Mumps/Rubella Immunity       I have discontinued Kara Robinson's Biotin, alendronate , meloxicam , pseudoephedrine , and promethazine -dextromethorphan. I am also having her start on rosuvastatin . Additionally, I am having her maintain her fluticasone , fexofenadine, cholecalciferol, cetirizine , ibuprofen , levothyroxine , and traZODone .  Meds ordered this encounter  Medications   rosuvastatin  (CRESTOR ) 5 MG tablet  Sig: Take 1 tablet (5 mg total) by mouth daily.    Dispense:  90 tablet    Refill:  1    Supervising Provider:   DOMENICA BLACKBIRD A [[id number]]

## 2024-01-12 NOTE — Assessment & Plan Note (Signed)
  Concern about measles immunity for travel.  - Order measles titer. - Pt requests coronary artery disease screening with cardiac CT. - Discussed optional gene screening for cancer mutations through [facility].

## 2024-01-12 NOTE — Assessment & Plan Note (Signed)
 CV risk calc estimates 10 yr risk as 7.3%.  Encouraged pt to trial crestor  for CV risk reduction.

## 2024-01-12 NOTE — Patient Instructions (Signed)
 VISIT SUMMARY:  Today, we discussed your cholesterol levels, osteoporosis treatment, and concerns about measles immunity for your upcoming travel. We also talked about potential screenings for coronary artery disease and cancer mutations.  YOUR PLAN:  HYPERLIPIDEMIA: Your cholesterol level is elevated at 255 mg/dL, and you have a family history of heart attacks. -Consider starting a low-dose statin, specifically Crestor  (rosuvastatin ) 5 mg. -We discussed the option of statin therapy given your cholesterol level and family history.  OSTEOPOROSIS: You have not been taking Fosamax  due to concerns about side effects, but you are at significant risk for fractures. -We will check your vitamin D  levels. -We will run Prolia  through your insurance to check for copay feasibility.  GENERAL HEALTH MAINTENANCE: You are concerned about your measles immunity for your upcoming travel and are interested in screenings for coronary artery disease and cancer mutations. -We will order a measles titer to check your immunity status. -Consider a coronary artery disease screening with a cardiac CT. -We can discuss optional gene screening for cancer mutations through [facility].

## 2024-01-13 ENCOUNTER — Ambulatory Visit: Payer: Self-pay | Admitting: Family

## 2024-01-13 LAB — MEASLES/MUMPS/RUBELLA IMMUNITY
Mumps IgG: >300 [AU]/ml
Rubella: 1.6 {index}
Rubeola IgG: >300 [AU]/ml

## 2024-01-14 ENCOUNTER — Telehealth: Payer: Self-pay

## 2024-01-14 ENCOUNTER — Other Ambulatory Visit (HOSPITAL_COMMUNITY): Payer: Self-pay

## 2024-01-14 MED ORDER — DENOSUMAB 60 MG/ML ~~LOC~~ SOSY
60.0000 mg | PREFILLED_SYRINGE | Freq: Once | SUBCUTANEOUS | Status: AC
  Administered 2024-02-04: 60 mg via SUBCUTANEOUS

## 2024-01-14 NOTE — Telephone Encounter (Signed)
 Prolia  order initiated.

## 2024-01-14 NOTE — Telephone Encounter (Signed)
 Pt ready for scheduling for PROLIA  on or after : 01/14/24  Option# 1: Buy/Bill (Office supplied medication)  Out-of-pocket cost due at time of clinic visit: $332  Number of injection/visits approved: ---  Primary: [employer] Prolia  co-insurance: 20% Admin fee co-insurance: 0%  Secondary: --- Prolia  co-insurance:  Admin fee co-insurance:   Medical Benefit Details: Date Benefits were checked: 01/14/24 Deductible: NO/ Coinsurance: 20%/ Admin Fee: 0%  Prior Auth: N/A PA# Expiration Date:   # of doses approved: ----------------------------------------------------------------------- Option# 2- Med Obtained from pharmacy:  Pharmacy benefit: Copay $250 (Paid to pharmacy) Admin Fee: 0% (Pay at clinic)  Prior Auth: N/A PA# Expiration Date:   # of doses approved:   If patient wants fill through the pharmacy benefit please send prescription to: [employer] [facility], and include estimated need by date in rx notes. Pharmacy will ship medication directly to the office.  Patient NOT eligible for Prolia  Copay Card. Copay Card can make patient's cost as little as $25. Link to apply: [email or web address]://www.amgensupportplus.[email or web address]/copay  ** This summary of benefits is an estimation of the patient's out-of-pocket cost. Exact cost may very based on individual plan coverage.

## 2024-01-14 NOTE — Telephone Encounter (Signed)
 Prolia VOB initiated via [email or web address]  Next Prolia inj DUE: NEW START

## 2024-01-15 ENCOUNTER — Ambulatory Visit (HOSPITAL_BASED_OUTPATIENT_CLINIC_OR_DEPARTMENT_OTHER)
Admission: RE | Admit: 2024-01-15 | Discharge: 2024-01-15 | Disposition: A | Discharge status: Home / Self Care | Payer: Self-pay | Source: Ambulatory Visit | Attending: Family | Admitting: Family

## 2024-01-15 DIAGNOSIS — Z0000 Encounter for general adult medical examination without abnormal findings: Secondary | ICD-10-CM | POA: Insufficient documentation

## 2024-01-25 MED ORDER — DENOSUMAB 60 MG/ML ~~LOC~~ SOSY: 60.0000 mg | PREFILLED_SYRINGE | Freq: Once | SUBCUTANEOUS | 0 refills | Status: AC

## 2024-01-25 NOTE — Addendum Note (Signed)
 Addended by: ESTELLE GILLIS D on: 01/25/2024 02:58 PM   Modules accepted: Orders

## 2024-02-04 ENCOUNTER — Ambulatory Visit (INDEPENDENT_AMBULATORY_CARE_PROVIDER_SITE_OTHER): Admitting: *Deleted

## 2024-02-04 DIAGNOSIS — M810 Age-related osteoporosis without current pathological fracture: Secondary | ICD-10-CM

## 2024-02-04 MED ORDER — DENOSUMAB 60 MG/ML ~~LOC~~ SOSY
60.0000 mg | PREFILLED_SYRINGE | SUBCUTANEOUS | Status: AC
  Administered 2024-08-11: 60 mg via SUBCUTANEOUS

## 2024-02-04 NOTE — Progress Notes (Signed)
 Patient here for Prolia  injection per physicians orders  Prolia  60 mg SQ , was administered left arm today. Patient tolerated injection.  Patient next injection due: 6 months, appt made:  No- will schedule in 5 months after benefits are ran again  Initial injection: yes  Did Prolia  come from pharmacy (if yes please select patient supplied): yes  Cam placed for next injection: yes

## 2024-02-22 ENCOUNTER — Other Ambulatory Visit: Payer: Self-pay | Admitting: Family

## 2024-03-08 ENCOUNTER — Ambulatory Visit (INDEPENDENT_AMBULATORY_CARE_PROVIDER_SITE_OTHER)

## 2024-03-08 VITALS — Ht 65.0 in | Wt 145.0 lb

## 2024-03-08 DIAGNOSIS — Z0000 Encounter for general adult medical examination without abnormal findings: Secondary | ICD-10-CM

## 2024-03-08 NOTE — Progress Notes (Signed)
 Subjective:   Kara Robinson is a 68 y.o. who presents for a Medicare Wellness preventive visit.  As a reminder, Annual Wellness Visits don't include a physical exam, and some assessments may be limited, especially if this visit is performed virtually. We may recommend an in-person follow-up visit with your provider if needed.  Visit Complete: Virtual I connected with  Heron LABOR Spang on 03/08/24 by a audio enabled telemedicine application and verified that I am speaking with the correct person using two identifiers.  Patient Location: Home  Provider Location: Home Office  I discussed the limitations of evaluation and management by telemedicine. The patient expressed understanding and agreed to proceed.  Vital Signs: Because this visit was a virtual/telehealth visit, some criteria may be missing or patient reported. Any vitals not documented were not able to be obtained and vitals that have been documented are patient reported.  VideoDeclined- This patient declined Librarian, academic. Therefore the visit was completed with audio only.  Persons Participating in Visit: Patient.  AWV Questionnaire: No: Patient Medicare AWV questionnaire was not completed prior to this visit.  Cardiac Risk Factors include: advanced age (>104men, >76 women);dyslipidemia     Objective:    Today's Vitals   03/08/24 1010  Weight: 145 lb (65.8 kg)  Height: 5' 5 (1.651 m)   Body mass index is 24.13 kg/m.     03/08/2024   10:13 AM 03/05/2023   10:37 AM 02/27/2022    1:43 PM 04/11/2018    8:58 PM  Advanced Directives  Does Patient Have a Medical Advance Directive? No Yes Yes No   Type of Special educational needs teacher of [place];Living will Healthcare Power of [place];Living will   Does patient want to make changes to medical advance directive?   No - Patient declined   Copy of Healthcare Power of Attorney in Chart?  No - copy requested No - copy requested    Would patient like information on creating a medical advance directive? Yes (MAU/Ambulatory/Procedural Areas - Information given)   No - Patient declined      Data saved with a previous flowsheet row definition    Current Medications (verified) Outpatient Encounter Medications as of 03/08/2024  Medication Sig   cetirizine  (ZYRTEC  ALLERGY) 10 MG tablet Take 1 tablet (10 mg total) by mouth daily.   cholecalciferol (VITAMIN D3) 25 MCG (1000 UT) tablet Take 1,000 Units by mouth daily.   fexofenadine (ALLEGRA) 180 MG tablet 1 tablet   fluticasone  (FLONASE ) 50 MCG/ACT nasal spray Place 2 sprays into both nostrils daily.   ibuprofen  (ADVIL ) 600 MG tablet Take 1 tablet (600 mg total) by mouth every 6 (six) hours as needed.   levothyroxine  (SYNTHROID ) 25 MCG tablet TAKE 0.5 TABLETS (12.5 MCG TOTAL) BY MOUTH DAILY BEFORE BREAKFAST.   meloxicam  (MOBIC ) 7.5 MG tablet TAKE 1 TABLET BY MOUTH EVERY DAY   rosuvastatin  (CRESTOR ) 5 MG tablet Take 1 tablet (5 mg total) by mouth daily.   traZODone  (DESYREL ) 50 MG tablet TAKE 1/2 TO 1 TABLET(25 TO 50 MG) BY MOUTH AT BEDTIME AS NEEDED FOR SLEEP   Facility-Administered Encounter Medications as of 03/08/2024  Medication   [START ON 08/02/2024] denosumab  (PROLIA ) injection 60 mg    Allergies (verified) Oxycodone and Augmentin  [amoxicillin -pot clavulanate]   History: Past Medical History:  Diagnosis Date   Allergy    Anemia    Arthritis    Hyperlipidemia    Family Hx   Osteoporosis    Thyroid  disease  Past Surgical History:  Procedure Laterality Date   APPENDECTOMY     BREAST EXCISIONAL BIOPSY Left    x 2   BREAST LUMPECTOMY WITH AXILLARY LYMPH NODE BIOPSY     HERNIA REPAIR     KNEE ARTHROSCOPY Left    Family History  Problem Relation Age of Onset   Heart disease Mother        MI at age 33, smoker   Hypertension Mother    Kidney disease Father    Kidney cancer Father    Heart disease Sister        heart valve replacement--2016   Diabetes  type II Sister    Other Sister        antisynthetase Syndrome   Heart attack Sister    Melanoma Sister    Leukemia Maternal Grandmother    Liver cancer Maternal Grandfather    Social History   Socioeconomic History   Marital status: Married    Spouse name: Not on file   Number of children: Not on file   Years of education: Not on file   Highest education level: Associate degree: occupational, Scientist, product/process development, or vocational program  Occupational History   Not on file  Tobacco Use   Smoking status: Never   Smokeless tobacco: Never  Vaping Use   Vaping status: Never Used  Substance and Sexual Activity   Alcohol use: Not Currently    Comment: occ   Drug use: Never   Sexual activity: Yes  Other Topics Concern   Not on file  Social History Narrative   She is unemployed,  Used to work as a Production designer, theatre/television/film at a bank   3 daughters, 7 grandchildren 1 great grandson (all local) moved from [place]  20 years ago.     Enjoys traveling, reading cooking, spending time with family   Married    Social Drivers of Corporate investment banker Strain: Low Risk  (03/08/2024)   Overall Financial Resource Strain (CARDIA)    Difficulty of Paying Living Expenses: Not hard at all  Food Insecurity: No Food Insecurity (03/08/2024)   Hunger Vital Sign    Worried About Running Out of Food in the Last Year: Never true    Ran Out of Food in the Last Year: Never true  Transportation Needs: No Transportation Needs (03/08/2024)   PRAPARE - Administrator, Civil Service (Medical): No    Lack of Transportation (Non-Medical): No  Physical Activity: Sufficiently Active (03/08/2024)   Exercise Vital Sign    Days of Exercise per Week: 7 days    Minutes of Exercise per Session: 50 min  Stress: Stress Concern Present (03/08/2024)   [employer] of Occupational Health - Occupational Stress Questionnaire    Feeling of Stress: To some extent  Social Connections: Socially Integrated (03/08/2024)   Social Connection and  Isolation Panel    Frequency of Communication with Friends and Family: More than three times a week    Frequency of Social Gatherings with Friends and Family: More than three times a week    Attends Religious Services: More than 4 times per year    Active Member of [employer] or Organizations: Yes    Attends Engineer, structural: More than 4 times per year    Marital Status: Married    Tobacco Counseling Counseling given: Not Answered    Clinical Intake:  Pre-visit preparation completed: Yes  Pain : No/denies pain  Diabetes: No   How often do you need to have  someone help you when you read instructions, pamphlets, or other written materials from your doctor or pharmacy?: 1 - Never  Interpreter Needed?: No  Information entered by :: Charmaine Bloodgood LPN   Activities of Daily Living     03/08/2024   10:13 AM  In your present state of health, do you have any difficulty performing the following activities:  Hearing? 0  Vision? 0  Difficulty concentrating or making decisions? 0  Walking or climbing stairs? 0  Dressing or bathing? 0  Doing errands, shopping? 0  Preparing Food and eating ? N  Using the Toilet? N  In the past six months, have you accidently leaked urine? N  Do you have problems with loss of bowel control? N  Managing your Medications? N  Managing your Finances? N  Housekeeping or managing your Housekeeping? N    Patient Care Team: Daryl Setter, NP as PCP - General (Internal Medicine) Gastroenterology, Margarete Door, Montie, MD as Referring Physician (Dermatology) [facility], [facility]   I have updated your Care Teams any recent Medical Services you may have received from other providers in the past year.     Assessment:   This is a routine wellness examination for Kara Robinson.  Hearing/Vision screen Hearing Screening - Comments:: Denies hearing difficulties   Vision Screening - Comments:: Wears rx glasses - up to date with  routine eye exams with [facility].    Goals Addressed             This Visit's Progress    Maintain health and independence   On track      Depression Screen     03/08/2024   10:14 AM 03/05/2023   10:41 AM 12/31/2022    8:15 AM 02/27/2022    1:44 PM 12/23/2021    7:04 AM 12/21/2020    7:10 AM 10/09/2020    3:23 PM  PHQ 2/9 Scores  PHQ - 2 Score 0 0 0 0 0 0 0  PHQ- 9 Score   0        Fall Risk     03/08/2024   10:13 AM 03/05/2023   10:36 AM 12/31/2022    8:14 AM 02/27/2022    1:44 PM 12/23/2021    7:04 AM  Fall Risk   Falls in the past year? 0 0 0 0 0  Number falls in past yr: 0 0 0 0 0  Injury with Fall? 0 0 0 0 0  Risk for fall due to : No Fall Risks No Fall Risks No Fall Risks No Fall Risks   Follow up Falls prevention discussed;Education provided;Falls evaluation completed Falls evaluation completed Falls evaluation completed Falls evaluation completed       Data saved with a previous flowsheet row definition    MEDICARE RISK AT HOME:  Medicare Risk at Home Any stairs in or around the home?: Yes If so, are there any without handrails?: No Home free of loose throw rugs in walkways, pet beds, electrical cords, etc?: Yes Adequate lighting in your home to reduce risk of falls?: Yes Life alert?: No Use of a cane, walker or w/c?: No Grab bars in the bathroom?: Yes Shower chair or bench in shower?: No Elevated toilet seat or a handicapped toilet?: Yes  TIMED UP AND GO:  Was the test performed?  No  Cognitive Function: 6CIT completed        03/08/2024   10:13 AM 03/05/2023   10:43 AM 02/27/2022    1:49 PM  6CIT Screen  What Year? 0 points 0 points 0 points  What month? 0 points 0 points 0 points  What time? 0 points 0 points 0 points  Count back from 20 0 points 0 points 0 points  Months in reverse 0 points 0 points 0 points  Repeat phrase 0 points 0 points 0 points  Total Score 0 points 0 points 0 points    Immunizations Immunization History  Administered  Date(s) Administered   Fluad Quad(high Dose 65+) 04/03/2021   Fluzone Influenza virus vaccine,trivalent (IIV3), split virus 04/24/2015, 05/06/2016   INFLUENZA, HIGH DOSE SEASONAL PF 04/09/2023   Influenza Split 04/25/2022   Influenza,inj,Quad PF,6+ Mos 04/07/2017, 03/16/2018, 04/07/2019   Influenza-Unspecified 05/15/2020   PFIZER Comirnaty (Gray Top)Covid-19 Tri-Sucrose Vaccine 12/21/2020   PFIZER(Purple Top)SARS-COV-2 Vaccination 09/20/2019, 10/11/2019, 05/15/2020, 04/25/2022   PNEUMOCOCCAL CONJUGATE-20 06/24/2021   Pfizer Covid-19 Vaccine Bivalent Booster 33yrs & up 04/03/2021, 11/29/2021   Pneumococcal Polysaccharide-23 06/20/2020   Respiratory Syncytial Virus Vaccine,Recomb Aduvanted(Arexvy) 05/11/2022   Tdap 04/28/2017   Zoster Recombinant(Shingrix ) 03/16/2018, 05/18/2018   Zoster, Live 03/15/2015    Screening Tests Health Maintenance  Topic Date Due   INFLUENZA VACCINE  02/05/2024   COVID-19 Vaccine (8 - 2025-26 season) 03/07/2024   Medicare Annual Wellness (AWV)  03/08/2025   MAMMOGRAM  07/20/2025   DTaP/Tdap/Td (2 - Td or Tdap) 04/29/2027   Colonoscopy  04/16/2033   Pneumococcal Vaccine: 50+ Years  Completed   DEXA SCAN  Completed   Hepatitis C Screening  Completed   Zoster Vaccines- Shingrix   Completed   HPV VACCINES  Aged Out   Meningococcal B Vaccine  Aged Out    Health Maintenance  Health Maintenance Due  Topic Date Due   INFLUENZA VACCINE  02/05/2024   COVID-19 Vaccine (8 - 2025-26 season) 03/07/2024    Additional Screening:  Vision Screening: Recommended annual ophthalmology exams for early detection of glaucoma and other disorders of the eye. Would you like a referral to an eye doctor? No    Dental Screening: Recommended annual dental exams for proper oral hygiene  Community Resource Referral / Chronic Care Management: CRR required this visit?  No   CCM required this visit?  No   Plan:    I have personally reviewed and noted the following in  the patient's chart:   Medical and social history Use of alcohol, tobacco or illicit drugs  Current medications and supplements including opioid prescriptions. Patient is not currently taking opioid prescriptions. Functional ability and status Nutritional status Physical activity Advanced directives List of other physicians Hospitalizations, surgeries, and ER visits in previous 12 months Vitals Screenings to include cognitive, depression, and falls Referrals and appointments  In addition, I have reviewed and discussed with patient certain preventive protocols, quality metrics, and best practice recommendations. A written personalized care plan for preventive services as well as general preventive health recommendations were provided to patient.   Lavelle Pfeiffer [place], [place]   [id number]   After Visit Summary: (MyChart) Due to this being a telephonic visit, the after visit summary with patients personalized plan was offered to patient via MyChart   Notes: Nothing significant to report at this time.

## 2024-03-08 NOTE — Patient Instructions (Signed)
 Kara Robinson , Thank you for taking time out of your busy schedule to complete your Annual Wellness Visit with me. I enjoyed our conversation and look forward to speaking with you again next year. I, as well as your care team,  appreciate your ongoing commitment to your health goals. Please review the following plan we discussed and let me know if I can assist you in the future. Your Game plan/ To Do List    Follow up Visits: We will see or speak with you next year for your Next Medicare AWV with our clinical staff Have you seen your provider in the last 6 months (3 months if uncontrolled diabetes)? Yes  Clinician Recommendations:  Aim for 30 minutes of exercise or brisk walking, 6-8 glasses of water, and 5 servings of fruits and vegetables each day.       This is a list of the screenings recommended for you:  Health Maintenance  Topic Date Due   Flu Shot  02/05/2024   COVID-19 Vaccine (8 - 2025-26 season) 03/07/2024   Medicare Annual Wellness Visit  03/08/2025   Mammogram  07/20/2025   DTaP/Tdap/Td vaccine (2 - Td or Tdap) 04/29/2027   Colon Cancer Screening  04/16/2033   Pneumococcal Vaccine for age over 82  Completed   DEXA scan (bone density measurement)  Completed   Hepatitis C Screening  Completed   Zoster (Shingles) Vaccine  Completed   HPV Vaccine  Aged Out   Meningitis B Vaccine  Aged Out    Advanced directives: (ACP Link)Information on Advanced Care Planning can be found at [place]  Secretary of [facility] Advance Health Care Directives Advance Health Care Directives. [email or web address]   Advance Care Planning is important because it:  [x]  Makes sure you receive the medical care that is consistent with your values, goals, and preferences  [x]  It provides guidance to your family and loved ones and reduces their decisional burden about whether or not they are making the right decisions based on your wishes.  Follow the link provided in your after visit summary or read over the  paperwork we have mailed to you to help you started getting your Advance Directives in place. If you need assistance in completing these, please reach out to us  so that we can help you!  See attachments for Preventive Care and Fall Prevention Tips.

## 2024-03-09 ENCOUNTER — Other Ambulatory Visit: Payer: Self-pay | Admitting: Family

## 2024-05-18 ENCOUNTER — Other Ambulatory Visit: Payer: Self-pay | Admitting: Family

## 2024-06-12 ENCOUNTER — Other Ambulatory Visit: Payer: Self-pay | Admitting: Family

## 2024-07-07 ENCOUNTER — Other Ambulatory Visit: Payer: Self-pay | Admitting: Family

## 2024-07-07 DIAGNOSIS — E785 Hyperlipidemia, unspecified: Secondary | ICD-10-CM

## 2024-07-13 ENCOUNTER — Ambulatory Visit: Admitting: Family

## 2024-07-13 ENCOUNTER — Other Ambulatory Visit (HOSPITAL_BASED_OUTPATIENT_CLINIC_OR_DEPARTMENT_OTHER): Payer: Self-pay | Admitting: Family

## 2024-07-13 ENCOUNTER — Ambulatory Visit: Payer: Self-pay | Admitting: Family

## 2024-07-13 VITALS — BP 133/53 | HR 66 | Temp 97.7°F | Resp 16 | Ht 65.0 in | Wt 137.0 lb

## 2024-07-13 DIAGNOSIS — M810 Age-related osteoporosis without current pathological fracture: Secondary | ICD-10-CM | POA: Diagnosis not present

## 2024-07-13 DIAGNOSIS — G4700 Insomnia, unspecified: Secondary | ICD-10-CM

## 2024-07-13 DIAGNOSIS — M1990 Unspecified osteoarthritis, unspecified site: Secondary | ICD-10-CM

## 2024-07-13 DIAGNOSIS — E785 Hyperlipidemia, unspecified: Secondary | ICD-10-CM | POA: Diagnosis not present

## 2024-07-13 DIAGNOSIS — Z1231 Encounter for screening mammogram for malignant neoplasm of breast: Secondary | ICD-10-CM

## 2024-07-13 DIAGNOSIS — E039 Hypothyroidism, unspecified: Secondary | ICD-10-CM

## 2024-07-13 LAB — COMPREHENSIVE METABOLIC PANEL WITH GFR
ALT: 8 U/L (ref 3–35)
AST: 13 U/L (ref 5–37)
Albumin: 4.5 g/dL (ref 3.5–5.2)
Alkaline Phosphatase: 35 U/L — ABNORMAL LOW (ref 39–117)
BUN: 15 mg/dL (ref 6–23)
CO2: 30 meq/L (ref 19–32)
Calcium: 9.1 mg/dL (ref 8.4–10.5)
Chloride: 103 meq/L (ref 96–112)
Creatinine, Ser: 0.67 mg/dL (ref 0.40–1.20)
GFR: 88.93 mL/min
Glucose, Bld: 82 mg/dL (ref 70–99)
Potassium: 4.5 meq/L (ref 3.5–5.1)
Sodium: 139 meq/L (ref 135–145)
Total Bilirubin: 0.5 mg/dL (ref 0.2–1.2)
Total Protein: 6.5 g/dL (ref 6.0–8.3)

## 2024-07-13 LAB — LIPID PANEL
Cholesterol: 188 mg/dL (ref 28–200)
HDL: 91.2 mg/dL
LDL Cholesterol: 85 mg/dL (ref 10–99)
NonHDL: 96.99
Total CHOL/HDL Ratio: 2
Triglycerides: 59 mg/dL (ref 10.0–149.0)
VLDL: 11.8 mg/dL (ref 0.0–40.0)

## 2024-07-13 LAB — TSH: TSH: 0.81 u[IU]/mL (ref 0.35–5.50)

## 2024-07-13 NOTE — Assessment & Plan Note (Signed)
 Rare use of meloxicam .

## 2024-07-13 NOTE — Assessment & Plan Note (Signed)
 Lab Results  Component Value Date   TSH 0.56 07/14/2023   She is on 12.5 mcg of synthroid , update TSH.

## 2024-07-13 NOTE — Assessment & Plan Note (Signed)
 She continues to sleep well with trazodone . Continue same.

## 2024-07-13 NOTE — Assessment & Plan Note (Signed)
 Plan for Prolia  dose after 08/06/24.

## 2024-07-13 NOTE — Progress Notes (Signed)
 "  Subjective:     Patient ID: Kara Robinson, female    DOB: [birth year], 69 y.o.   MRN: [id number]  Chief Complaint  Patient presents with   Hypothyroidism    Here for follow up    HPI  Discussed the use of AI scribe software for clinical note transcription with the patient, who gave verbal consent to proceed.  History of Present Illness Kara Robinson is a 69 year old female who presents for a medication check and blood work.  She is currently taking Crestor  5 mg daily for hyperlipidemia without adverse effects. Her last cholesterol check a year ago showed a total cholesterol of 255 mg/dL and LDL of 855 mg/dL. She is eager to assess the effectiveness of the medication with current blood work.  She has a history of hypothyroidism and is taking half of her prescribed levothyroxine  25 mcg daily (12.5 mcg), as she questions the necessity of continuing this medication due to its small dose.  Her sleep has significantly improved since starting trazodone , which she describes as a 'secretary/administrator'. She had struggled with sleep disturbances for years without understanding the cause.  Her arthritis pain is currently manageable. She follows a Mediterranean diet and has adjusted her exercise routine to include riding a recumbent bike and strength training, which has helped alleviate plantar fasciitis symptoms. She occasionally takes meloxicam , particularly during times of increased sugar intake, which she notes exacerbates her inflammation.  She reports doing well with her allergies and is not currently taking any allergy medications, having weaned off Flonase  and Singulair  due to dry mouth. Her allergies are more prominent in the spring.      There are no preventive care reminders to display for this patient.  Past Medical History:  Diagnosis Date   Allergy    Anemia    Arthritis    Hyperlipidemia    Family Hx   Osteoporosis    Thyroid  disease     Past Surgical History:   Procedure Laterality Date   APPENDECTOMY     BREAST EXCISIONAL BIOPSY Left    x 2   BREAST LUMPECTOMY WITH AXILLARY LYMPH NODE BIOPSY     HERNIA REPAIR     KNEE ARTHROSCOPY Left     Family History  Problem Relation Age of Onset   Heart disease Mother        MI at age 82, smoker   Hypertension Mother    Kidney disease Father    Kidney cancer Father    Heart disease Sister        heart valve replacement--2016   Diabetes type II Sister    Other Sister        antisynthetase Syndrome   Heart attack Sister    Melanoma Sister    Leukemia Maternal Grandmother    Liver cancer Maternal Grandfather     Social History   Socioeconomic History   Marital status: Married    Spouse name: Not on file   Number of children: Not on file   Years of education: Not on file   Highest education level: Associate degree: occupational, scientist, product/process development, or vocational program  Occupational History   Not on file  Tobacco Use   Smoking status: Never   Smokeless tobacco: Never  Vaping Use   Vaping status: Never Used  Substance and Sexual Activity   Alcohol use: Not Currently    Comment: occ   Drug use: Never   Sexual activity: Yes  Other Topics Concern  Not on file  Social History Narrative   She is unemployed,  Used to work as a production designer, theatre/television/film at a bank   3 daughters, 7 grandchildren 1 great grandson (all local) moved from [place]  20 years ago.     Enjoys traveling, reading cooking, spending time with family   Married    Social Drivers of Health   Tobacco Use: Low Risk (03/08/2024)   Patient History    Smoking Tobacco Use: Never    Smokeless Tobacco Use: Never    Passive Exposure: Not on file  Financial Resource Strain: Low Risk (03/08/2024)   Overall Financial Resource Strain (CARDIA)    Difficulty of Paying Living Expenses: Not hard at all  Food Insecurity: No Food Insecurity (03/08/2024)   Epic    Worried About Radiation Protection Practitioner of Food in the Last Year: Never true    Ran Out of Food in the Last Year:  Never true  Transportation Needs: No Transportation Needs (03/08/2024)   Epic    Lack of Transportation (Medical): No    Lack of Transportation (Non-Medical): No  Physical Activity: Sufficiently Active (03/08/2024)   Exercise Vital Sign    Days of Exercise per Week: 7 days    Minutes of Exercise per Session: 50 min  Stress: Stress Concern Present (03/08/2024)   [employer] of Occupational Health - Occupational Stress Questionnaire    Feeling of Stress: To some extent  Social Connections: Socially Integrated (03/08/2024)   Social Connection and Isolation Panel    Frequency of Communication with Friends and Family: More than three times a week    Frequency of Social Gatherings with Friends and Family: More than three times a week    Attends Religious Services: More than 4 times per year    Active Member of Clubs or Organizations: Yes    Attends Banker Meetings: More than 4 times per year    Marital Status: Married  Catering Manager Violence: Not At Risk (03/08/2024)   Epic    Fear of Current or Ex-Partner: No    Emotionally Abused: No    Physically Abused: No    Sexually Abused: No  Depression (PHQ2-9): Low Risk (07/13/2024)   Depression (PHQ2-9)    PHQ-2 Score: 0  Alcohol Screen: Low Risk (03/08/2024)   Alcohol Screen    Last Alcohol Screening Score (AUDIT): 3  Housing: Unknown (03/08/2024)   Epic    Unable to Pay for Housing in the Last Year: No    Number of Times Moved in the Last Year: Not on file    Homeless in the Last Year: No  Utilities: Not At Risk (03/08/2024)   Epic    Threatened with loss of utilities: No  Health Literacy: Adequate Health Literacy (03/08/2024)   B1300 Health Literacy    Frequency of need for help with medical instructions: Never    Outpatient Medications Prior to Visit  Medication Sig Dispense Refill   cetirizine  (ZYRTEC  ALLERGY) 10 MG tablet Take 1 tablet (10 mg total) by mouth daily. 30 tablet 0   cholecalciferol (VITAMIN D3) 25 MCG (1000  UT) tablet Take 1,000 Units by mouth daily.     fexofenadine (ALLEGRA) 180 MG tablet 1 tablet     fluticasone  (FLONASE ) 50 MCG/ACT nasal spray Place 2 sprays into both nostrils daily. 16 g 6   ibuprofen  (ADVIL ) 600 MG tablet Take 1 tablet (600 mg total) by mouth every 6 (six) hours as needed. 30 tablet 0   levothyroxine  (SYNTHROID ) 25 MCG tablet  TAKE 0.5 TABLETS (12.5 MCG TOTAL) BY MOUTH DAILY BEFORE BREAKFAST. 45 tablet 1   meloxicam  (MOBIC ) 7.5 MG tablet TAKE 1 TABLET BY MOUTH EVERY DAY 14 tablet 0   rosuvastatin  (CRESTOR ) 5 MG tablet TAKE 1 TABLET (5 MG TOTAL) BY MOUTH DAILY. 90 tablet 1   traZODone  (DESYREL ) 50 MG tablet TAKE 1/2 TO 1 TABLET(25 TO 50 MG) BY MOUTH AT BEDTIME AS NEEDED FOR SLEEP 90 tablet 1   Facility-Administered Medications Prior to Visit  Medication Dose Route Frequency Provider Last Rate Last Admin   [START ON 08/02/2024] denosumab  (PROLIA ) injection 60 mg  60 mg Subcutaneous Q6 months O'Sullivan, Jedd Schulenburg, NP        Allergies[1]  ROS    See HPI  Objective:    Physical Exam Constitutional:      General: She is not in acute distress.    Appearance: Normal appearance. She is well-developed.  HENT:     Head: Normocephalic and atraumatic.     Right Ear: External ear normal.     Left Ear: External ear normal.  Eyes:     General: No scleral icterus. Neck:     Thyroid : No thyroid  mass, thyromegaly or thyroid  tenderness.  Cardiovascular:     Rate and Rhythm: Normal rate and regular rhythm.     Heart sounds: Normal heart sounds. No murmur heard. Pulmonary:     Effort: Pulmonary effort is normal. No respiratory distress.     Breath sounds: Normal breath sounds. No wheezing.  Musculoskeletal:     Cervical back: Neck supple.  Skin:    General: Skin is warm and dry.  Neurological:     Mental Status: She is alert and oriented to person, place, and time.  Psychiatric:        Mood and Affect: Mood normal.        Behavior: Behavior normal.        Thought Content:  Thought content normal.        Judgment: Judgment normal.      BP (!) 133/53 (BP Location: Right Arm, Patient Position: Sitting, Cuff Size: Normal)   Pulse 66   Temp 97.7 F (36.5 C) (Oral)   Resp 16   Ht 5' 5 (1.651 m)   Wt 137 lb (62.1 kg)   SpO2 100%   BMI 22.80 kg/m  Wt Readings from Last 3 Encounters:  07/13/24 137 lb (62.1 kg)  03/08/24 145 lb (65.8 kg)  01/12/24 145 lb (65.8 kg)       Assessment & Plan:   Problem List Items Addressed This Visit       Unprioritized   Osteoporosis   Plan for Prolia  dose after 08/06/24.        Osteoarthritis   Rare use of meloxicam .       Insomnia   She continues to sleep well with trazodone . Continue same.       Hypothyroid   Lab Results  Component Value Date   TSH 0.56 07/14/2023   She is on 12.5 mcg of synthroid , update TSH.       Relevant Orders   TSH   Hyperlipidemia - Primary   Lab Results  Component Value Date   CHOL 255 (H) 07/14/2023   HDL 96.30 07/14/2023   LDLCALC 144 (H) 07/14/2023   TRIG 73.0 07/14/2023   CHOLHDL 3 07/14/2023   Tolerating crestor  without side effects  Will update lipid panel, continue crestor .       Relevant Orders   Lipid panel  Comp Met (CMET)    I am having Kriston A. Hackley maintain her fluticasone , fexofenadine, cholecalciferol, cetirizine , ibuprofen , levothyroxine , traZODone , meloxicam , and rosuvastatin . We will continue to administer denosumab .  No orders of the defined types were placed in this encounter.     [1]  Allergies Allergen Reactions   Oxycodone Itching and Rash    Scratched herself until she bled   Augmentin  [Amoxicillin -Pot Clavulanate] Rash   "

## 2024-07-13 NOTE — Patient Instructions (Signed)
" °  VISIT SUMMARY: Today, you came in for a medication check and blood work. We reviewed your current medications and discussed your overall health, including your cholesterol, thyroid  function, arthritis, and osteoporosis management. We also talked about your general health maintenance and upcoming screenings.  YOUR PLAN: -HYPERLIPIDEMIA: Hyperlipidemia means having high levels of fats (lipids) in your blood, which can increase your risk of heart disease. You are currently managing this with Crestor  5 mg daily, and we have ordered a lipid panel to check your current cholesterol levels.  -HYPOTHYROIDISM: Hypothyroidism is a condition where your thyroid  gland doesn't produce enough thyroid  hormone, which can affect your metabolism. You are taking Levothyroxine  25 mcg daily, and we have ordered thyroid  function tests to evaluate your current thyroid  status.  -OSTEOARTHRITIS: Osteoarthritis is a type of arthritis that occurs when the protective cartilage that cushions the ends of your bones wears down over time. Your pain is well-controlled with your current exercise routine and occasional use of Meloxicam . Continue with your recumbent biking and strength training, and use Meloxicam  as needed for pain.  -OSTEOPOROSIS: Osteoporosis is a condition where bones become weak and brittle. You are awaiting insurance approval for Prolia  treatment. Your bone density was last assessed in January 2025.  -GENERAL HEALTH MAINTENANCE: For your general health, you have a mammogram scheduled for January 19th, 2026, a wellness exam planned for September 2027, and a bone density screening due in 2027. Routine health maintenance is important to catch any potential issues early.  INSTRUCTIONS: Please proceed with your scheduled mammogram on January 19th, 2026. Also, remember to schedule your wellness exam for September 2027 and your bone density screening for  2027.                                                 "

## 2024-07-13 NOTE — Assessment & Plan Note (Signed)
 Lab Results  Component Value Date   CHOL 255 (H) 07/14/2023   HDL 96.30 07/14/2023   LDLCALC 144 (H) 07/14/2023   TRIG 73.0 07/14/2023   CHOLHDL 3 07/14/2023   Tolerating crestor  without side effects  Will update lipid panel, continue crestor .

## 2024-07-14 ENCOUNTER — Telehealth: Payer: Self-pay

## 2024-07-14 ENCOUNTER — Other Ambulatory Visit (HOSPITAL_COMMUNITY): Payer: Self-pay

## 2024-07-14 NOTE — Telephone Encounter (Signed)
 Prolia  VOB initiated via [email or web address]  Next Prolia  inj DUE: 08/06/24

## 2024-07-18 ENCOUNTER — Other Ambulatory Visit: Payer: Self-pay | Admitting: Family

## 2024-07-18 ENCOUNTER — Other Ambulatory Visit (HOSPITAL_COMMUNITY): Payer: Self-pay

## 2024-07-18 NOTE — Telephone Encounter (Signed)
 Pt ready for scheduling for PROLIA  on or after : 08/06/24  Option# 1: Buy/Bill (Office supplied medication)  Out-of-pocket cost due at time of clinic visit: $352  Number of injection/visits approved: ---  Primary: [employer] Prolia  co-insurance: 20% Admin fee co-insurance: 0%  Secondary: --- Prolia  co-insurance:  Admin fee co-insurance:   Medical Benefit Details: Date Benefits were checked: 07/14/24 Deductible: NO/ Coinsurance: 20%/ Admin Fee: 0%  Prior Auth: N/A PA# Expiration Date:   # of doses approved: ----------------------------------------------------------------------- Option# 2- Med Obtained from pharmacy: [facility] FOR PHARMACY BENEFIT  Pharmacy benefit: Copay $659.68 (Paid to pharmacy) Admin Fee: 0% (Pay at clinic)  Prior Auth: N/A PA# Expiration Date:   # of doses approved:   If patient wants fill through the pharmacy benefit please send prescription to: [facility], and include estimated need by date in rx notes. Pharmacy will ship medication directly to the office.  Patient NOT eligible for Prolia  Copay Card. Copay Card can make patient's cost as little as $25. Link to apply: [email or web address]://[email or web address]  ** This summary of benefits is an estimation of the patient's out-of-pocket cost. Exact cost may very based on individual plan coverage.

## 2024-07-25 ENCOUNTER — Ambulatory Visit (HOSPITAL_BASED_OUTPATIENT_CLINIC_OR_DEPARTMENT_OTHER)
Admission: RE | Admit: 2024-07-25 | Discharge: 2024-07-25 | Disposition: A | Source: Ambulatory Visit | Attending: Family | Admitting: Family

## 2024-07-25 ENCOUNTER — Encounter (HOSPITAL_BASED_OUTPATIENT_CLINIC_OR_DEPARTMENT_OTHER): Payer: Self-pay

## 2024-07-25 DIAGNOSIS — Z1231 Encounter for screening mammogram for malignant neoplasm of breast: Secondary | ICD-10-CM | POA: Diagnosis present

## 2024-07-27 ENCOUNTER — Ambulatory Visit: Payer: Self-pay | Admitting: Family

## 2024-08-08 ENCOUNTER — Ambulatory Visit

## 2024-08-11 ENCOUNTER — Ambulatory Visit (INDEPENDENT_AMBULATORY_CARE_PROVIDER_SITE_OTHER)

## 2024-08-11 DIAGNOSIS — M810 Age-related osteoporosis without current pathological fracture: Secondary | ICD-10-CM

## 2024-08-11 MED ORDER — DENOSUMAB 60 MG/ML ~~LOC~~ SOSY: 60.0000 mg | PREFILLED_SYRINGE | SUBCUTANEOUS | Status: AC

## 2024-08-11 NOTE — Progress Notes (Signed)
 Patient here for Prolia  injection per physicians orders  Prolia  60 mg SQ , was administered left arm today. Patient tolerated injection.  Patient next injection due: 6 months, appt made:  No- will schedule in 5 months after benefits are ran again  Initial injection: yes  Did Prolia  come from pharmacy (if yes please select patient supplied): yes  Cam placed for next injection: yes

## 2024-08-11 NOTE — Addendum Note (Signed)
 Addended by: ESTELLE GILLIS D on: 08/11/2024 11:31 AM   Modules accepted: Orders

## 2025-01-11 ENCOUNTER — Ambulatory Visit: Admitting: Family

## 2025-03-09 ENCOUNTER — Ambulatory Visit
